# Patient Record
Sex: Male | Born: 1935 | Race: White | Hispanic: No | Marital: Single | Smoking: Never smoker
Health system: Southern US, Community
[De-identification: ages and names within clinical notes are randomized; demographics above are authoritative.]

## PROBLEM LIST (undated history)

## (undated) DIAGNOSIS — C801 Malignant (primary) neoplasm, unspecified: Secondary | ICD-10-CM

## (undated) DIAGNOSIS — C73 Malignant neoplasm of thyroid gland: Secondary | ICD-10-CM

## (undated) DIAGNOSIS — K219 Gastro-esophageal reflux disease without esophagitis: Secondary | ICD-10-CM

## (undated) DIAGNOSIS — I4891 Unspecified atrial fibrillation: Secondary | ICD-10-CM

## (undated) DIAGNOSIS — E039 Hypothyroidism, unspecified: Secondary | ICD-10-CM

## (undated) DIAGNOSIS — M1990 Unspecified osteoarthritis, unspecified site: Secondary | ICD-10-CM

## (undated) HISTORY — PX: THYROIDECTOMY: SHX17

## (undated) HISTORY — DX: Malignant neoplasm of thyroid gland: C73

## (undated) HISTORY — DX: Malignant (primary) neoplasm, unspecified: C80.1

## (undated) NOTE — *Deleted (*Deleted)
[facility]  [address] [place], [place] [address] Phone: [phone]  Fax: [phone]   Clinic Day:  12/15/2019  Referring physician: Mick Sell, MD  Chief Complaint: Grant Freeman is a 77 y.o. male with medullary thyroid cancer who is seen for assessment, review of work-up, and discussion regarding direction of therapy.  HPI: The patient was last seen in the medical oncology clinic on 12/02/2019. At that time, he denied any new symptoms.  Exam was stable.  We discussed plans for biopsy.  [facility] genetic testing was sent.  Right supraclavicular lymph node biopsy on 12/11/2019 revealed ***  During the interim, ***   Past Medical History:  Diagnosis Date  . Thyroid cancer, medullary carcinoma [facility])     Past Surgical History:  Procedure Laterality Date  . THYROIDECTOMY      No family history on file.  Social History:  reports that he has never smoked. He has quit using smokeless tobacco. He reports current alcohol use of about 2.0 standard drinks of alcohol per week. He reports that he does not use drugs. The patient drinks beer or wine a couple times per weeks. Denies tobacco or drug use. He still lives in [place]. He is a retired Personnel officer. The patient is alone today.  Allergies:  Allergies  Allergen Reactions  . Penicillins Hives    Current Medications: Current Outpatient Medications  Medication Sig Dispense Refill  . acetaminophen (TYLENOL) 500 MG tablet Take by mouth. (Patient not taking: Reported on 10/29/2019)    . hydrOXYzine (VISTARIL) 25 MG capsule Take 25 mg by mouth every 6 (six) hours as needed.     Marland Kitchen levothyroxine (SYNTHROID, LEVOTHROID) 112 MCG tablet Take 1 tablet by mouth daily.    Marland Kitchen MELATONIN PO Take 1 Dose by mouth at bedtime as needed.     . saw palmetto 80 MG capsule Take 80 mg by mouth 2 (two) times daily. (Patient not taking: Reported on 10/29/2019)    . vandetanib (CAPRELSA) 300 MG tablet  Take 1 tablet (300 mg total) by mouth daily. (Patient not taking: Reported on 10/29/2019) 30 tablet 0   No current facility-administered medications for this visit.  Review of Systems  Constitutional: Negative for chills, diaphoresis, fever, malaise/fatigue and weight loss (up 1 lb).  HENT: Negative for congestion, ear discharge, ear pain, hearing loss, nosebleeds, sinus pain, sore throat and tinnitus.        Hoarse voice.  Eyes: Negative for blurred vision.  Respiratory: Negative for cough, hemoptysis, sputum production and shortness of breath.   Cardiovascular: Negative for chest pain, palpitations and leg swelling.  Gastrointestinal: Negative for abdominal pain, blood in stool, constipation, diarrhea, heartburn, melena, nausea and vomiting.       Consuming baking powder and maple syrup twice weekly.  Genitourinary: Negative for dysuria, frequency, hematuria and urgency.  Musculoskeletal: Negative for back pain, joint pain, myalgias and neck pain.  Skin: Negative for itching and rash.  Neurological: Negative for dizziness, tingling, sensory change, weakness and headaches.  Endo/Heme/Allergies: Does not bruise/bleed easily.  Psychiatric/Behavioral: Negative for depression and memory loss. The patient is not nervous/anxious and does not have insomnia.   All other systems reviewed and are negative.  Performance status (ECOG): 1***  Vitals There were no vitals taken for this visit.   Physical Exam Vitals and nursing note reviewed.  Constitutional:      General: He is not in acute distress.    Appearance: He is not diaphoretic.  Eyes:  General: No scleral icterus.    Conjunctiva/sclera: Conjunctivae normal.  Neurological:     Mental Status: He is alert and oriented to person, place, and time.  Psychiatric:        Behavior: Behavior normal.        Thought Content: Thought content normal.        Judgment: Judgment normal.    No visits with results within 3 Day(s) from this  visit.  Latest known visit with results is:  Office Visit on 11/25/2019  Component Date Value Ref Range Status  . Calcitonin 11/25/2019 4,975.0* 0.0 - 8.4 pg/mL Final   Comment: (NOTE) **Results verified by repeat testing** Siemens Immulite 2000 Immunochemiluminometric assay (ICMA) Values obtained with different assay methods or kits cannot be used interchangeably. Results cannot be interpreted as absolute evidence of the presence or absence of malignant disease. Performed At: [facility] [address] [place], [place] [id number] Jolene Schimke MD YQ:[id number]   . CEA 11/25/2019 77.1* 0.0 - 4.7 ng/mL Final   Comment: (NOTE)                             Nonsmokers          <3.9                             Smokers             <5.6 Roche [employer] Electrochemiluminescence Immunoassay (ECLIA) Values obtained with different assay methods or kits cannot be used interchangeably.  Results cannot be interpreted as absolute evidence of the presence or absence of malignant disease. Performed At: [facility] [address] [place], [place] [id number] Jolene Schimke MD WN:[id number]   . Sodium 11/25/2019 136  135 - 145 mmol/L Final  . Potassium 11/25/2019 4.5  3.5 - 5.1 mmol/L Final  . Chloride 11/25/2019 103  98 - 111 mmol/L Final  . CO2 11/25/2019 24  22 - 32 mmol/L Final  . Glucose, Bld 11/25/2019 107* 70 - 99 mg/dL Final   Glucose reference range applies only to samples taken after fasting for at least 8 hours.  . BUN 11/25/2019 13  8 - 23 mg/dL Final  . Creatinine, Ser 11/25/2019 0.83  0.61 - 1.24 mg/dL Final  . Calcium 03/47/4259 9.2  8.9 - 10.3 mg/dL Final  . Total Protein 11/25/2019 8.1  6.5 - 8.1 g/dL Final  . Albumin 56/38/7564 4.6  3.5 - 5.0 g/dL Final  . AST 33/29/5188 33  15 - 41 U/L Final  . ALT 11/25/2019 45* 0 - 44 U/L Final  . Alkaline Phosphatase 11/25/2019 55  38 - 126 U/L Final  . Total Bilirubin 11/25/2019 1.1  0.3 - 1.2 mg/dL Final  . GFR calc  non Af Amer 11/25/2019 >60  >60 mL/min Final  . GFR calc Af Amer 11/25/2019 >60  >60 mL/min Final  . Anion gap 11/25/2019 9  5 - 15 Final   Performed at [facility], [address], [place], [place] [address]  . WBC 11/25/2019 5.8  4.0 - 10.5 K/uL Final  . RBC 11/25/2019 5.12  4.22 - 5.81 MIL/uL Final  . Hemoglobin 11/25/2019 16.0  13.0 - 17.0 g/dL Final  . HCT 63/02/6008 47.1  39 - 52 % Final  . MCV 11/25/2019 92.0  80.0 - 100.0 fL Final  . MCH 11/25/2019 31.3  26.0 - 34.0 pg Final  .  MCHC 11/25/2019 34.0  30.0 - 36.0 g/dL Final  . RDW 16/11/9602 14.2  11.5 - 15.5 % Final  . Platelets 11/25/2019 290  150 - 400 K/uL Final  . nRBC 11/25/2019 0.0  0.0 - 0.2 % Final  . Neutrophils Relative % 11/25/2019 62  % Final  . Neutro Abs 11/25/2019 3.6  1.7 - 7.7 K/uL Final  . Lymphocytes Relative 11/25/2019 21  % Final  . Lymphs Abs 11/25/2019 1.2  0.7 - 4.0 K/uL Final  . Monocytes Relative 11/25/2019 11  % Final  . Monocytes Absolute 11/25/2019 0.6  0.1 - 1.0 K/uL Final  . Eosinophils Relative 11/25/2019 5  % Final  . Eosinophils Absolute 11/25/2019 0.3  0.0 - 0.5 K/uL Final  . Basophils Relative 11/25/2019 1  % Final  . Basophils Absolute 11/25/2019 0.1  0.0 - 0.1 K/uL Final  . Immature Granulocytes 11/25/2019 0  % Final  . Abs Immature Granulocytes 11/25/2019 0.01  0.00 - 0.07 K/uL Final   Performed at [facility], [address] [place], [place] [address]    Assessment:  Grant Freeman is a 38 y.o. male with stage IV medullary thyroid cancer s/p medullary thyroid cancer s/p bilateral thyroidectomy 07/06/2000.  Pathology revealed a 5.2 cm medullary carcinoma. Cancer extended up to but not through the thyroid capsule. 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 Right sentinel lymph node was negative. Left central neck lymph node was positive with a small focus of metastatic medullary carcinoma. Two lymph nodes in the left upper  mediastinum were negative for cancer and a third paratracheal lymph node was negative.  He received an unknown number cycles of carboplatin and Taxol beginning in 11/2001.  He developed recurrent disease in 2006. He underwent right neck dissection on 10/04/2004.  There was a 4.2 cm soft tissue mass of recurrent medullary thyroid cancer. Four of 4 right-sided neck nodes were positive.  The largest node was 4.2 cm with extracapsular extension into the soft tissue.  A right neck level IIB dissection revealed 8 of 11 lymph nodes positive for metastatic thyroid cancer. The largest focus was 5 mm with tumor present in perinodal lymphatics.  He received DTIC (dacarbazine) with some improvement in serum markers in 2010.  PET scan on 03/11/2010 revealed increased FDG uptake in the left base of neck evidence of distant metastasis.  Notes indicate additional neck dissection in 2012 for recurrent disease.  He received vandetanib from 05/2009 through 05/17/2010. Treatment was interrupted in 12/2009 secondary to an infection in his arm. Dose was reduced from 300 mg a day to 02/2010 at 200 mg a day in 02/2010. He believes his tumor marker dramatically improved with therapy.  He received no TKI therapy between 2012 in 2014 secondary to patient refusal. Calcitonin remained in the 875 range in 03/2012.  In 03/2012, he began ingesting and applying "blood root" to his neck.  PET scan on 10/18/2012 revealed an increase in a 3.25mm nodule to 5mm, but no visible disease in neck, mediastinum or abdomen.  Recommendation was for no vandetanib.  PET scan on 04/01/2016 was suboptimal secondary to altered biomechanics after recent food ingestion prior to the scan. There was new retrotracheal, paraesophageal soft tissue nodule without increased metabolic activity (may falsely lower SUV secondary to altered biomechanics).There was unchanged subcentimeter pulmonary nodule in the right upper lobe which was below the size threshold  for PET imaging.  Neck soft tissue CT scan on 04/01/2016 revealed new 1.2 x  0.9 cm retrotracheal, paraesophageal enhancing soft tissue nodule suspicious for metastatic disease, but does not appear to be associated with definite increased metabolic activity.  There was redemonstration of a 1.6 x 0.9 cm enhancing soft tissue nodules in the right thyroid bed and adjacent to the cricothyroid cartilage.  There was no significant interval change in size of right 6 mm apical pulmonary nodule.  [facility] scan [facility] on 09/30/2016 revealed redemonstrated minimally FDG avid retrotracheal, paraesophageal soft tissue with limited comparison to prior study given previous muscle uptake. This remained concerning for possible metastatic disease. There was unchanged CT appearance of enhancing cricothyroid soft tissue also with mild FDG avidity, likely ectopic thyroid tissue or level VI lymph nodes;  metastatic disease was not excluded. There was no evidence of new FDG avid disease.  Diagnostic neck CT on 09/30/2016 revealed stable appearance of right retrotracheal, paraesophageal enhancing soft tissue nodule (1.3 x 0.9 cm compared to 1.2 x 0.9 cm). This lesion was suspicious for metastatic disease. Attention on follow-up.  There was redemonstration of extensive enhancing soft tissue nodules in the right thyroid bed and adjacent the cricothyroid cartilage which were unchanged in size and may represent residual or regenerated thyroid tissue.  There was opacification of the maxillary, ethmoid and frontal sinuses which may be seen in the setting of sinusitis.  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   PET scan on 11/21/2019 noted diffuse low level hypermetabolism seen in the left thyroid cartilage region including asymmetric soft tissue just superficial to the cartilage. This may reflect the FDG avid "cricopharyngeal soft tissue" described in the outside report from 2018. Metastatic disease remained a concern. There was a  1.9 cm right retrotracheal soft tissue nodule in the region of the thoracic inlet shows low level hypermetabolism, concerning for metastatic disease. There was a 9 mm anterior right upper lobe pulmonary nodule increased from 4 mm on the study 9 years ago.  There was no hypermetabolism. Relatively slow growth would suggest a benign etiology, but follow-up may be warranted to exclude low-grade, poorly FDG avid neoplasm. There was a 5 mm right lower lobe pulmonary nodule new since 2012. Continue attention on follow-up recommended. There was hepatic steatosis.  Soft tissue neck CT on 11/22/2019 revealed a 2 cm mass at the right tracheoesophageal groove  new from PET scan in 2012, presumed local recurrence. A 7 x 17 mm nodule anterior to the upper cricoid cartilage was also worrisome, but not hypermetabolic. There was a 11 mm lymph node at the junction of the right supraclavicular fossa and IJ chain, larger than in 2012 and suspicious.  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 There was a right upper lobe pulmonary nodule, reference preceding PET-CT.  Calcitonin has been followed:  2,721 on 05/24/2010, 706 on 08/11/2010, 714 on 10/06/2010, 768 on 11/26/2010, 1,013 on 07/14/2011, 893 on 04/03/2012, 956 on 06/04/2012, 653 on 10/01/2012, 967 on 10/18/2012, 601 on 12/03/2012, 753 and 11/05/2013, 1,121 on 02/06/2014, 833 on 05/06/2014, 1321 on 08/19/2014, 1171 on 03/10/2016, 1411 on 12/22/2015, 1,254 on 04/06/2016, 1260 on 09/13/2016, 2000 on 04/20/2018, 4831 on 10/16/2019, and 4975.0 on 11/25/2019.  CEA has been followed:  43 and 09/16/2009, 40.8 on 10/05/2009, 38.5 on 11/09/2009, 46.6 on 12/16/2009, 36.9 on 01/27/2010, 40.1 on 02/23/2010, 42.1 on 03/25/2010, 47.9 on 04/20/2010, 44.6 on 05/24/2010, 22.2 on 08/11/2010, 15.2 on 10/06/2010, 18.4 on 11/26/2010, 19.4 on 07/14/2011, 23 on 04/03/2012, 22.6 on 10/01/2012, 35.4 on 12/22/2015, 43.9 on 04/06/2016, 43.5 on 09/13/2016, 69.4 on 04/20/2018, 73.7  on 10/16/2019, and  77.1 on 11/25/2019.  The patient does not plan to receive the COVID-19 vaccine.  Symptomatically, ***  Plan: 1.   Review labs   2.   Medullary thyroid carcinoma             Clinically, he denies any new symptoms.             His clinical course has been indolent.                         Kinase inhibitor therapy may not be appropriate for slowly progressive/indolent disease.                         Previously, he received vandetinib; vandetinib has a RR of about 37% in clinical trials.                         There are side effects with therapy which can affect quality of life.                                     Side effects include increased QT, dermatologic toxicity, diarrhea, CHF, hypertension, hemorrhage,CVA, ILD, hypocalcemia.             Calcitonin and CEA have increased over time.             Discuss plan for comparison of PET scan at [facility] to current scan.                         For asymptomatic patients with tumor < 1-2 cm tumor growing < 20 %/year, guidelines suggest monitoring.                         For metastasis at least 1-2 cm, growing > 20 %/year or symptomatic, consider treatment.             Patient previously not considered a surgical candidate.             Review interval tumor board discussion.  There has been interval growth.  Discuss plan for biopsy. 3.   [facility] genetic testing. 4.   Ultrasound guided biopsy. 5.   EKG- baseline. 6.   Bethanie Dicker: re vandatrnib coverage. 7.   RTC in 2 weeks for MD assessment, review of work-up, and discussion regarding direction of therapy.  I discussed the assessment and treatment plan with the patient.  The patient was provided an opportunity to ask questions and all were answered.  The patient agreed with the plan and demonstrated an understanding of the instructions.  The patient was advised to call back if the symptoms worsen or if the condition fails to improve as anticipated.  I provided *** minutes of face-to-face  time during this this encounter and > 50% was spent counseling as documented under my assessment and plan.  Melissa C. Merlene Pulling, MD, PhD    12/15/2019, 6:30 PM  I, Danella Penton Tufford, am acting as Neurosurgeon for [employer]. Merlene Pulling, MD, PhD.  I, Melissa C. Merlene Pulling, MD, have reviewed the above documentation for accuracy and completeness, and I agree with the above.

## (undated) NOTE — *Deleted (*Deleted)
Grant Freeman  [address] [place], [place] [address] Phone: [phone]  Fax: [phone]   Clinic Day:  12/21/2019  Referring physician: Mick Sell, MD  Chief Complaint: Grant Freeman is a 11 y.o. male with medullary thyroid cancer who is seen for review of interm biopsy, [facility] genetic testing, and discussion regarding direction of therapy.   HPI: The patient was last seen in the medical oncology clinic on 12/02/2019. At that time, he denied any new symptoms. Exam was stable. We discussed plans for a ultrasound guided biopsy and [facility] genetic testing.   Right supraclavicular biopsy on 12/11/2019 confirmed metastatic carcinoma.  IHC for calcitonin was diffuse and strong staining confirming the diagnosis of metastatic medullary carcinoma of the thyroid.  Patient was seen in the [facility] ER on 12/20/2019 for a peri-rectal abscess.  Patient stated that he had a biopsy on 11/06/2019 in the general surgeon's office.  Patient stated that the surgeon "missed the whole thing".  He stated that he can still feel a boil and wanted it lanced in the emergency department. The area was intermittently painful.  He denied any bleeding or drainage; he denied fever. Exam revealed a 1 cm round, firm umbilicated lesion at 4 o'clock position in the peri-rectal area.  Lesion was non-fluctuant and non-erythematous.  Follow up with general surgery was recommended.   During the interim, ***   Past Medical History:  Diagnosis Date  . Thyroid cancer, medullary carcinoma [facility])     Past Surgical History:  Procedure Laterality Date  . THYROIDECTOMY      No family history on file.  Social History:  reports that he has never smoked. He has quit using smokeless tobacco. He reports current alcohol use of about 2.0 standard drinks of alcohol per week. He reports that he does not use drugs. The patient drinks beer or wine a couple times per weeks. Denies tobacco or  drug use. He still lives in [place]. He is a retired Personnel officer. The patient is alone today.  Allergies:  Allergies  Allergen Reactions  . Penicillins Hives    Current Medications: Current Outpatient Medications  Medication Sig Dispense Refill  . acetaminophen (TYLENOL) 500 MG tablet Take by mouth. (Patient not taking: Reported on 10/29/2019)    . hydrOXYzine (VISTARIL) 25 MG capsule Take 25 mg by mouth every 6 (six) hours as needed.     Marland Kitchen levothyroxine (SYNTHROID, LEVOTHROID) 112 MCG tablet Take 1 tablet by mouth daily.    Marland Kitchen lidocaine (XYLOCAINE) 2 % jelly Apply 1 application topically as needed. 30 mL 0  . MELATONIN PO Take 1 Dose by mouth at bedtime as needed.     . saw palmetto 80 MG capsule Take 80 mg by mouth 2 (two) times daily. (Patient not taking: Reported on 10/29/2019)    . vandetanib (CAPRELSA) 300 MG tablet Take 1 tablet (300 mg total) by mouth daily. (Patient not taking: Reported on 10/29/2019) 30 tablet 0   No current facility-administered medications for this visit.  Review of Systems  Constitutional: Negative for chills, diaphoresis, fever, malaise/fatigue and weight loss (up 1 lb).  HENT: Negative for congestion, ear discharge, ear pain, hearing loss, nosebleeds, sinus pain, sore throat and tinnitus.        Hoarse voice.  Eyes: Negative for blurred vision.  Respiratory: Negative for cough, hemoptysis, sputum production and shortness of breath.   Cardiovascular: Negative for chest pain, palpitations and leg swelling.  Gastrointestinal: Negative for abdominal pain, blood in  stool, constipation, diarrhea, heartburn, melena, nausea and vomiting.       Consuming baking powder and maple syrup twice weekly.  Genitourinary: Negative for dysuria, frequency, hematuria and urgency.  Musculoskeletal: Negative for back pain, joint pain, myalgias and neck pain.  Skin: Negative for itching and rash.  Neurological: Negative for dizziness, tingling, sensory change, weakness and  headaches.  Endo/Heme/Allergies: Does not bruise/bleed easily.  Psychiatric/Behavioral: Negative for depression and memory loss. The patient is not nervous/anxious and does not have insomnia.   All other systems reviewed and are negative.  Performance status (ECOG): 1  Vitals There were no vitals taken for this visit.   Physical Exam Vitals and nursing note reviewed.  Constitutional:      General: He is not in acute distress.    Appearance: He is not diaphoretic.  Eyes:     General: No scleral icterus.    Conjunctiva/sclera: Conjunctivae normal.  Neurological:     Mental Status: He is alert and oriented to person, place, and time.  Psychiatric:        Behavior: Behavior normal.        Thought Content: Thought content normal.        Judgment: Judgment normal.    No visits with results within 3 Day(s) from this visit.  Latest known visit with results is:  [facility] on 12/11/2019  Component Date Value Ref Range Status  . SURGICAL PATHOLOGY 12/11/2019    Final-Edited                   Value:SURGICAL PATHOLOGY **** THIS IS AN ADDENDUM REPORT **** CASE: [id number] PATIENT: Grant Freeman Surgical Pathology Report **********Addendum **********  Reason for Addendum #1:  Immunohistochemistry results  Specimen Submitted: A. Lymph node, right supraclavicular; biopsy  Clinical History: Medullary carcinoma of the thyroid with recurrence. Previous thyroidectomy at [facility] in 2002 with positive lymph nodes, and resection of nodal metastases at [facility] in 2006 and 2012. Post chemotherapy and kinase inhibitor therapy.     DIAGNOSIS: A. LYMPH NODE, RIGHT SUPRACLAVICULAR; ULTRASOUND-GUIDED CORE BIOPSY: - METASTATIC CARCINOMA, SEE COMMENT.  Comment: A few very tiny deposits of metastatic carcinoma are present, confirmed by cytokeratin immunohistochemistry (IHC) (AE1/AE3/PCK26). The tumor cells show nested growth, minimal pleomorphism, and absent mitotic figures,  morphologically compatible with metastatic medullary carcinoma of the thyroid.  Calcitonin IHC has b                         een ordered, and the results will be resulted in an addendum.  Cytokeratin IHC slides were prepared by [facility] Hines, [place]. All controls stained appropriately.  This test was developed and its performance characteristics determined by [facility]. It has not been cleared or approved by the [place] Food and Drug Administration. The FDA does not require this test to go through premarket FDA review. This test is used for clinical purposes. It should not be regarded as investigational or for research. This laboratory is certified under the Clinical Laboratory Improvement Amendments (CLIA) as qualified to perform high complexity clinical laboratory testing.  GROSS DESCRIPTION: A. Labeled: Right supraclavicular node Received: Formalin Number of needle core biopsy(s): 3 cores with multiple additional fragments Length: Range from 0.7 to 0.9 cm Diameter: 0.1 cm Description: Received are cores and fragments of tan-gray soft tissue. The fragments are 0.7 x 0.5 x 0.1 cm in aggregat  e. Ink: None Entirely submitted in cassettes 1-4 with 1 core per cassettes 1-3 and the remaining fragments in cassette 4.    Final Diagnosis performed by Ronald Lobo, MD.   Electronically signed 12/16/2019 12:42:13PM The electronic signature indicates that the named Attending Pathologist has evaluated the specimen Technical component performed at [facility], [address] [place], [place] [address] Lab: [phone] Dir: Jolene Schimke, MD, MMM  Professional component performed at [facility], [facility], [address] [place], [place], [place] [address] Lab: [phone] Dir: Georgiann Cocker. Oneita Kras, MD  ADDENDUM: IHC for calcitonin was performed. The tumor cells are positive for calcitonin with diffuse and strong staining. The result confirms the diagnosis of  metastatic medullary carcinoma of the thyroid.  IHC slides were prepared by [facility] [facility], [place], [place]. All controls stained appropriately.  This test was develope                         d and its performance characteristics determined by [facility]. It has not been cleared or approved by the [place] Food and Drug Administration. The FDA does not require this test to go through premarket FDA review. This test is used for clinical purposes. It should not be regarded as investigational or for research. This laboratory is certified under the Clinical Laboratory Improvement Amendments (CLIA) as qualified to perform high complexity clinical laboratory testing.    Addendum #1 performed by Ronald Lobo, MD.   Electronically signed 12/20/2019 12:01:27PM The electronic signature indicates that the named Attending Pathologist has evaluated the specimen Technical component performed at [facility], [address], [place], [place] [address] Lab: [phone] Dir: Jolene Schimke, MD, MMM  Professional component performed at [facility], [facility], [address] [place], [place], [place] [address] Lab: [phone] Dir: Georgiann Cocker. Rubinas, MD     Assessment:  Grant Freeman is a 18 y.o. male with stage IV medullary thyroid cancer s/p medullary thyroid cancer s/p bilateral thyroidectomy 07/06/2000.  Pathology revealed a 5.2 cm medullary carcinoma. Cancer extended up to but not through the thyroid capsule. 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 Right sentinel lymph node was negative. Left central neck lymph node was positive with a small focus of metastatic medullary carcinoma. Two lymph nodes in the left upper mediastinum were negative for cancer and a third paratracheal lymph node was negative.  He received an unknown number cycles of carboplatin and Taxol beginning in 11/2001.  He developed recurrent disease in 2006. He  underwent right neck dissection on 10/04/2004.  There was a 4.2 cm soft tissue mass of recurrent medullary thyroid cancer. Four of 4 right-sided neck nodes were positive.  The largest node was 4.2 cm with extracapsular extension into the soft tissue.  A right neck level IIB dissection revealed 8 of 11 lymph nodes positive for metastatic thyroid cancer. The largest focus was 5 mm with tumor present in perinodal lymphatics.  He received DTIC (dacarbazine) with some improvement in serum markers in 2010.  PET scan on 03/11/2010 revealed increased FDG uptake in the left base of neck evidence of distant metastasis.  Notes indicate additional neck dissection in 2012 for recurrent disease.  He received vandetanib from 05/2009 through 05/17/2010. Treatment was interrupted in 12/2009 secondary to an infection in his arm. Dose was reduced from 300 mg a day to 02/2010 at 200 mg a day in 02/2010. He believes his tumor marker dramatically improved with therapy.  He received no TKI  therapy between 2012 in 2014 secondary to patient refusal. Calcitonin remained in the 875 range in 03/2012.  In 03/2012, he began ingesting and applying "blood root" to his neck.  PET scan on 10/18/2012 revealed an increase in a 3.47mm nodule to 5mm, but no visible disease in neck, mediastinum or abdomen.  Recommendation was for no vandetanib.  PET scan on 04/01/2016 was suboptimal secondary to altered biomechanics after recent food ingestion prior to the scan. There was new retrotracheal, paraesophageal soft tissue nodule without increased metabolic activity (may falsely lower SUV secondary to altered biomechanics).There was unchanged subcentimeter pulmonary nodule in the right upper lobe which was below the size threshold for PET imaging.  Neck soft tissue CT scan on 04/01/2016 revealed new 1.2 x 0.9 cm retrotracheal, paraesophageal enhancing soft tissue nodule suspicious for metastatic disease, but does not appear to be associated  with definite increased metabolic activity.  There was redemonstration of a 1.6 x 0.9 cm enhancing soft tissue nodules in the right thyroid bed and adjacent to the cricothyroid cartilage.  There was no significant interval change in size of right 6 mm apical pulmonary nodule.  PET scan at [facility] on 09/30/2016 revealed redemonstrated minimally FDG avid retrotracheal, paraesophageal soft tissue with limited comparison to prior study given previous muscle uptake. This remained concerning for possible metastatic disease. There was unchanged CT appearance of enhancing cricothyroid soft tissue also with mild FDG avidity, likely ectopic thyroid tissue or level VI lymph nodes;  metastatic disease was not excluded. There was no evidence of new FDG avid disease.  Diagnostic neck CT on 09/30/2016 revealed stable appearance of right retrotracheal, paraesophageal enhancing soft tissue nodule (1.3 x 0.9 cm compared to 1.2 x 0.9 cm). This lesion was suspicious for metastatic disease. Attention on follow-up.  There was redemonstration of extensive enhancing soft tissue nodules in the right thyroid bed and adjacent the cricothyroid cartilage which were unchanged in size and may represent residual or regenerated thyroid tissue.  There was opacification of the maxillary, ethmoid and frontal sinuses which may be seen in the setting of sinusitis.  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   PET scan on 11/21/2019 noted diffuse low level hypermetabolism seen in the left thyroid cartilage region including asymmetric soft tissue just superficial to the cartilage. This may reflect the FDG avid "cricopharyngeal soft tissue" described in the outside report from 2018. Metastatic disease remained a concern. There was a 1.9 cm right retrotracheal soft tissue nodule in the region of the thoracic inlet shows low level hypermetabolism, concerning for metastatic disease. There was a 9 mm anterior right upper lobe pulmonary nodule increased  from 4 mm on the study 9 years ago.  There was no hypermetabolism. Relatively slow growth would suggest a benign etiology, but follow-up may be warranted to exclude low-grade, poorly FDG avid neoplasm. There was a 5 mm right lower lobe pulmonary nodule new since 2012. Continue attention on follow-up recommended. There was hepatic steatosis.  Soft tissue neck CT on 11/22/2019 revealed a 2 cm mass at the right tracheoesophageal groove  new from PET scan in 2012, presumed local recurrence. A 7 x 17 mm nodule anterior to the upper cricoid cartilage was also worrisome, but not hypermetabolic. There was a 11 mm lymph node at the junction of the right supraclavicular fossa and IJ chain, larger than in 2012 and suspicious.  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 There was a right upper lobe pulmonary nodule, reference preceding PET-CT.  Right supraclavicular biopsy  on 12/11/2019 confirmed metastatic carcinoma.  IHC for calcitonin was diffuse and strong staining confirming the diagnosis of metastatic medullary carcinoma of the thyroid.  Calcitonin has been followed:  2,721 on 05/24/2010, 706 on 08/11/2010, 714 on 10/06/2010, 768 on 11/26/2010, 1,013 on 07/14/2011, 893 on 04/03/2012, 956 on 06/04/2012, 653 on 10/01/2012, 967 on 10/18/2012, 601 on 12/03/2012, 753 and 11/05/2013, 1,121 on 02/06/2014, 833 on 05/06/2014, 1321 on 08/19/2014, 1171 on 03/10/2016, 1411 on 12/22/2015, 1,254 on 04/06/2016, 1260 on 09/13/2016, 2000 on 04/20/2018, 4831 on 10/16/2019, and 4975.0 on 11/25/2019.  CEA has been followed:  43 and 09/16/2009, 40.8 on 10/05/2009, 38.5 on 11/09/2009, 46.6 on 12/16/2009, 36.9 on 01/27/2010, 40.1 on 02/23/2010, 42.1 on 03/25/2010, 47.9 on 04/20/2010, 44.6 on 05/24/2010, 22.2 on 08/11/2010, 15.2 on 10/06/2010, 18.4 on 11/26/2010, 19.4 on 07/14/2011, 23 on 04/03/2012, 22.6 on 10/01/2012, 35.4 on 12/22/2015, 43.9 on 04/06/2016, 43.5 on 09/13/2016, 69.4 on 04/20/2018, 73.7 on 10/16/2019, and 77.1 on  11/25/2019.  The patient does not plan to receive the COVID-19 vaccine.  Symptomatically, ***  Plan: 1.   Review work up.   2.   Medullary thyroid carcinoma             Clinically, he denies any new symptoms.             His clinical course has been indolent.                         Kinase inhibitor therapy may not be appropriate for slowly progressive/indolent disease.                         Previously, he received vandetinib; vandetinib has a RR of about 37% in clinical trials.                         There are side effects with therapy which can affect quality of life.                                     Side effects include increased QT, dermatologic toxicity, diarrhea, CHF, hypertension, hemorrhage,CVA, ILD, hypocalcemia.             Calcitonin and CEA have increased over time.             Discuss plan for comparison of PET scan at [facility] to current scan.                         For asymptomatic patients with tumor < 1-2 cm tumor growing < 20 %/year, guidelines suggest monitoring.                         For metastasis at least 1-2 cm, growing > 20 %/year or symptomatic, consider treatment.             Patient previously not considered a surgical candidate.             Review interval tumor board discussion.  There has been interval growth.  Discuss plan for biopsy. 3.   [facility] genetic testing. 4.   Ultrasound guided biopsy. 5.   EKG- baseline. 6.   Bethanie Dicker: re vandatrnib coverage. 7.   RTC in 2 weeks for MD assessment, review of work-up, and discussion regarding direction of  therapy.  I discussed the assessment and treatment plan with the patient.  The patient was provided an opportunity to ask questions and all were answered.  The patient agreed with the plan and demonstrated an understanding of the instructions.  The patient was advised to call back if the symptoms worsen or if the condition fails to improve as anticipated.  I provided *** minutes of face-to-face time  during this encounter and > 50% was spent counseling as documented under my assessment and plan.    Melissa C. Merlene Pulling, MD, PhD    12/21/2019, 1:30 PM  I, Theador Hawthorne, am acting as scribe for [employer]. Merlene Pulling, MD, PhD.  {Add scribe attestation statement}

## (undated) NOTE — *Deleted (*Deleted)
[facility]  [address], [address] [place], [place] [address] Phone: [phone]  Fax: [phone]   Clinic Day:  01/21/2020  Referring physician: Mick Sell, MD  Chief Complaint: Grant Freeman is a 35 y.o. male with medullary thyroid cancer who is seen for 2 week assessment.   HPI: The patient was last seen in the medical oncology clinic on 01/07/2020. At that time, he noted issues with a perianal "boil".  He described occasional shortness of breath.  Exam was c/w atrial fibrillation.  Hematocrit was 47.8, hemoglobin 16.3, platelets 300,000, WBC 6,700. ALT was 53.  TSH was 0.016 and free T4 was 0.85.  We discussed referral to cardiology and clearance to begin vandetinib.  The patient saw Marga Hoots, cardiology PA, on 01/09/2020. ECG revealed atrial fibrillation at a controlled ventricular response of 98 bpm with no evidence of acute ischemia. He was prescribed metoprolol succinate 25 mg daily and Eliquis 5 mg BID. A 7 day Holter monitor was placed to assess atrial fibrillation burden.  Echocardiogram was scheduled.  He was counseled regarding refraining from alcohol.  Follow up was planned in 6 weeks (around 02/20/2020).   The patient saw Dr. Sampson Goon on 01/16/2020 for anal pain.  Exam revealed a hemorrhoid and an area of induration at the 1 o'clock position.  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  Reassessment by surgery was discussed.  Dr Claudine Mouton was contacted.  During the interim, ***    Past Medical History:  Diagnosis Date  . Thyroid cancer, medullary carcinoma [facility])     Past Surgical History:  Procedure Laterality Date  . THYROIDECTOMY      No family history on file.  Social History:  reports that he has never smoked. He has quit using smokeless tobacco. He reports current alcohol use of about 2.0 standard drinks of alcohol per week. He reports that he does not use drugs. The patient drinks  beer or wine a couple times per weeks. Denies tobacco or drug use. He still lives in [place]. He is a retired Personnel officer. The patient is alone today.  Allergies:  Allergies  Allergen Reactions  . Penicillins Hives    Current Medications: Current Outpatient Medications  Medication Sig Dispense Refill  . levothyroxine (SYNTHROID, LEVOTHROID) 112 MCG tablet Take 1 tablet by mouth daily.    Marland Kitchen lidocaine (XYLOCAINE) 2 % jelly Apply 1 application topically as needed. 30 mL 0   No current facility-administered medications for this visit.  Review of Systems  Constitutional: Negative for chills, diaphoresis, fever, malaise/fatigue and weight loss (stable).       "Could be better."  HENT: Negative for congestion, ear discharge, ear pain, hearing loss, nosebleeds, sinus pain, sore throat and tinnitus.        Hoarse voice.  Eyes: Negative for blurred vision.  Respiratory: Negative for cough, hemoptysis, sputum production and shortness of breath.   Cardiovascular: Negative for chest pain, palpitations and leg swelling.  Gastrointestinal: Negative for abdominal pain, blood in stool, constipation, diarrhea, heartburn, melena, nausea and vomiting.  Genitourinary: Negative for dysuria, frequency, hematuria and urgency.  Musculoskeletal: Positive for myalgias (leg cramps). Negative for back pain, joint pain and neck pain.  Skin: Negative for itching and rash.       Left ankle wrapped because of an infection  Neurological: Negative for dizziness, tingling, sensory change, weakness and headaches.  Endo/Heme/Allergies: Does not bruise/bleed easily.  Psychiatric/Behavioral: Negative for depression and memory loss. The patient  has insomnia (last night). The patient is not nervous/anxious.   All other systems reviewed and are negative.  Performance status (ECOG): 1***  Vitals There were no vitals taken for this visit.   Physical Exam Vitals and nursing note reviewed.  Constitutional:       General: He is not in acute distress.    Appearance: He is not diaphoretic.  HENT:     Head: Normocephalic and atraumatic.     Mouth/Throat:     Mouth: Mucous membranes are moist.     Pharynx: Oropharynx is clear.  Eyes:     General: No scleral icterus.    Extraocular Movements: Extraocular movements intact.     Conjunctiva/sclera: Conjunctivae normal.     Pupils: Pupils are equal, round, and reactive to light.  Cardiovascular:     Rate and Rhythm: Normal rate. Rhythm irregular.     Heart sounds: Normal heart sounds. No murmur heard.   Pulmonary:     Effort: Pulmonary effort is normal. No respiratory distress.     Breath sounds: Normal breath sounds. No wheezing or rales.  Chest:     Chest wall: No tenderness.  Abdominal:     General: Bowel sounds are normal. There is no distension.     Palpations: Abdomen is soft. There is no mass.     Tenderness: There is no abdominal tenderness. There is no guarding or rebound.  Musculoskeletal:        General: No swelling or tenderness. Normal range of motion.     Cervical back: Normal range of motion and neck supple.  Lymphadenopathy:     Head:     Right side of head: No preauricular, posterior auricular or occipital adenopathy.     Left side of head: No preauricular, posterior auricular or occipital adenopathy.     Cervical: No cervical adenopathy.     Upper Body:     Right upper body: No supraclavicular or axillary adenopathy.     Left upper body: No supraclavicular or axillary adenopathy.     Lower Body: No right inguinal adenopathy. No left inguinal adenopathy.  Skin:    General: Skin is warm and dry.     Comments: Left ankle is wrapped.  Neurological:     Mental Status: He is alert and oriented to person, place, and time.  Psychiatric:        Behavior: Behavior normal.        Thought Content: Thought content normal.        Judgment: Judgment normal.    No visits with results within 3 Day(s) from this visit.  Latest known  visit with results is:  Hospital Outpatient Visit on 12/11/2019  Component Date Value Ref Range Status  . SURGICAL PATHOLOGY 12/11/2019    Final-Edited                   Value:SURGICAL PATHOLOGY THIS IS AN ADDENDUM REPORT CASE: [id number] PATIENT: Grant Freeman Surgical Pathology Report Addendum  Reason for Addendum #1:  Immunohistochemistry results  Specimen Submitted: A. Lymph node, right supraclavicular; biopsy  Clinical History: Medullary carcinoma of the thyroid with recurrence. Previous thyroidectomy at [facility] in 2002 with positive lymph nodes, and resection of nodal metastases at [facility] in 2006 and 2012. Post chemotherapy and kinase inhibitor therapy.  DIAGNOSIS: A. LYMPH NODE, RIGHT SUPRACLAVICULAR; ULTRASOUND-GUIDED CORE BIOPSY: - METASTATIC CARCINOMA, SEE COMMENT.  Comment: A few very tiny deposits of metastatic carcinoma are present, confirmed by cytokeratin immunohistochemistry (IHC) (AE1/AE3/PCK26). The tumor cells  show nested growth, minimal pleomorphism, and absent mitotic figures, morphologically compatible with metastatic medullary carcinoma of the thyroid.  Calcitonin IHC has b                         een ordered, and the results will be resulted in an addendum.  Cytokeratin IHC slides were prepared by [facility] Hines, [place]. All controls stained appropriately.  This test was developed and its performance characteristics determined by [facility]. It has not been cleared or approved by the [place] Food and Drug Administration. The FDA does not require this test to go through premarket FDA review. This test is used for clinical purposes. It should not be regarded as investigational or for research. This laboratory is certified under the Clinical Laboratory Improvement Amendments (CLIA) as qualified to perform high complexity clinical laboratory testing.  GROSS DESCRIPTION: A. Labeled: Right supraclavicular node Received: Formalin Number of needle core  biopsy(s): 3 cores with multiple additional fragments Length: Range from 0.7 to 0.9 cm Diameter: 0.1 cm Description: Received are cores and fragments of tan-gray soft tissue. The fragments are 0.7 x 0.5 x 0.1 cm in aggregat                         e. Ink: None Entirely submitted in cassettes 1-4 with 1 core per cassettes 1-3 and the remaining fragments in cassette 4.  Final Diagnosis performed by Ronald Lobo, MD.   Electronically signed 12/16/2019 12:42:13PM The electronic signature indicates that the named Attending Pathologist has evaluated the specimen Technical component performed at [facility], [address] [place], [place] [address] Lab: [phone] Dir: Jolene Schimke, MD, MMM  Professional component performed at [facility], [facility], [address] [place], [place], [place] [address] Lab: [phone] Dir: Georgiann Cocker. Oneita Kras, MD  ADDENDUM: IHC for calcitonin was performed. The tumor cells are positive for calcitonin with diffuse and strong staining. The result confirms the diagnosis of metastatic medullary carcinoma of the thyroid.  IHC slides were prepared by [facility], [place], [place]. All controls stained appropriately.  This test was develope                         d and its performance characteristics determined by [facility]. It has not been cleared or approved by the [place] Food and Drug Administration. The FDA does not require this test to go through premarket FDA review. This test is used for clinical purposes. It should not be regarded as investigational or for research. This laboratory is certified under the Clinical Laboratory Improvement Amendments (CLIA) as qualified to perform high complexity clinical laboratory testing.  Addendum #1 performed by Ronald Lobo, MD.   Electronically signed 12/20/2019 12:01:27PM The electronic signature indicates that the named Attending Pathologist has evaluated the specimen  Technical component performed at [facility], [address] [place], [place] [address] Lab: [phone] Dir: Jolene Schimke, MD, MMM  Professional component performed at [facility], [facility], [address] [place], [place], [place] [address] Lab: [phone] Dir: Georgiann Cocker. Rub[place], MD    Assessment:  Grant Freeman is a 22 y.o. male with stage IV medullary thyroid cancer s/p medullary thyroid cancer s/p bilateral thyroidectomy 07/06/2000.  Pathology revealed a 5.2 cm medullary carcinoma. Cancer extended up to but not through the thyroid capsule. 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 Right sentinel lymph node was negative. Left central neck lymph  node was positive with a small focus of metastatic medullary carcinoma. Two lymph nodes in the left upper mediastinum were negative for cancer and a third paratracheal lymph node was negative.  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  Variants of uncertain significance included: ALK c.1184G>A (p.Arg395His) and NF1 c.5288A>T (p.Tyr1763Phe).  He received an unknown number cycles of carboplatin and Taxol beginning in 11/2001.  He developed recurrent disease in 2006. He underwent right neck dissection on 10/04/2004.  There was a 4.2 cm soft tissue mass of recurrent medullary thyroid cancer. Four of 4 right-sided neck nodes were positive.  The largest node was 4.2 cm with extracapsular extension into the soft tissue.  A right neck level IIB dissection revealed 8 of 11 lymph nodes positive for metastatic thyroid cancer. The largest focus was 5 mm with tumor present in perinodal lymphatics.  He received DTIC (dacarbazine) with some improvement in serum markers in 2010.  PET scan on 03/11/2010 revealed increased FDG uptake in the left base of  neck evidence of distant metastasis.  Notes indicate additional neck dissection in 2012 for recurrent disease.  He received vandetanib from 05/2009 through 05/17/2010. Treatment was interrupted in 12/2009 secondary to an infection in his arm. Dose was reduced from 300 mg a day to 02/2010 at 200 mg a day in 02/2010. He believes his tumor marker dramatically improved with therapy.  He received no TKI therapy between 2012 in 2014 secondary to patient refusal. Calcitonin remained in the 875 range in 03/2012.  In 03/2012, he began ingesting and applying "blood root" to his neck.  PET scan on 10/18/2012 revealed an increase in a 3.67mm nodule to 5mm, but no visible disease in neck, mediastinum or abdomen.  Recommendation was for no vandetanib.  PET scan on 04/01/2016 was suboptimal secondary to altered biomechanics after recent food ingestion prior to the scan. There was new retrotracheal, paraesophageal soft tissue nodule without increased metabolic activity (may falsely lower SUV secondary to altered biomechanics).There was unchanged subcentimeter pulmonary nodule in the right upper lobe which was below the size threshold for PET imaging.  Neck soft tissue CT scan on 04/01/2016 revealed new 1.2 x 0.9 cm retrotracheal, paraesophageal enhancing soft tissue nodule suspicious for metastatic disease, but does not appear to be associated with definite increased metabolic activity.  There was redemonstration of a 1.6 x 0.9 cm enhancing soft tissue nodules in the right thyroid bed and adjacent to the cricothyroid cartilage.  There was no significant interval change in size of right 6 mm apical pulmonary nodule.  PET scan at [facility] on 09/30/2016 revealed redemonstrated minimally FDG avid retrotracheal, paraesophageal soft tissue with limited comparison to prior study given previous muscle uptake. This remained concerning for possible metastatic disease. There was unchanged CT appearance of enhancing cricothyroid  soft tissue also with mild FDG avidity, likely ectopic thyroid tissue or level VI lymph nodes;  metastatic disease was not excluded. There was no evidence of new FDG avid disease.  Diagnostic neck CT on 09/30/2016 revealed stable appearance of right retrotracheal, paraesophageal enhancing soft tissue nodule (1.3 x 0.9 cm compared to 1.2 x 0.9 cm). This lesion was suspicious for metastatic disease. Attention on follow-up.  There was redemonstration of extensive enhancing soft tissue nodules in the right thyroid bed and adjacent the cricothyroid cartilage which were unchanged in size and may represent residual or regenerated  thyroid tissue.  There was opacification of the maxillary, ethmoid and frontal sinuses which may be seen in the setting of sinusitis.  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   PET scan on 11/21/2019 noted diffuse low level hypermetabolism seen in the left thyroid cartilage region including asymmetric soft tissue just superficial to the cartilage. This may reflect the FDG avid "cricopharyngeal soft tissue" described in the outside report from 2018. Metastatic disease remained a concern. There was a 1.9 cm right retrotracheal soft tissue nodule in the region of the thoracic inlet shows low level hypermetabolism, concerning for metastatic disease. There was a 9 mm anterior right upper lobe pulmonary nodule increased from 4 mm on the study 9 years ago.  There was no hypermetabolism. Relatively slow growth would suggest a benign etiology, but follow-up may be warranted to exclude low-grade, poorly FDG avid neoplasm. There was a 5 mm right lower lobe pulmonary nodule new since 2012. Continue attention on follow-up recommended. There was hepatic steatosis.  Soft tissue neck CT on 11/22/2019 revealed a 2 cm mass at the right tracheoesophageal groove  new from PET scan in 2012, presumed local recurrence. A 7 x 17 mm nodule anterior to the upper cricoid cartilage was also worrisome, but not  hypermetabolic. There was a 11 mm lymph node at the junction of the right supraclavicular fossa and IJ chain, larger than in 2012 and suspicious.  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 There was a right upper lobe pulmonary nodule, reference preceding PET-CT.  Right supraclavicular biopsy on 12/11/2019 confirmed metastatic carcinoma.  IHC for calcitonin was diffuse and strong staining confirming the diagnosis of metastatic medullary carcinoma of the thyroid.  Calcitonin has been followed:  2,721 on 05/24/2010, 706 on 08/11/2010, 714 on 10/06/2010, 768 on 11/26/2010, 1,013 on 07/14/2011, 893 on 04/03/2012, 956 on 06/04/2012, 653 on 10/01/2012, 967 on 10/18/2012, 601 on 12/03/2012, 753 and 11/05/2013, 1,121 on 02/06/2014, 833 on 05/06/2014, 1321 on 08/19/2014, 1171 on 03/10/2016, 1411 on 12/22/2015, 1,254 on 04/06/2016, 1260 on 09/13/2016, 2000 on 04/20/2018, 4831 on 10/16/2019, and 4975.0 on 11/25/2019.  CEA has been followed:  43 and 09/16/2009, 40.8 on 10/05/2009, 38.5 on 11/09/2009, 46.6 on 12/16/2009, 36.9 on 01/27/2010, 40.1 on 02/23/2010, 42.1 on 03/25/2010, 47.9 on 04/20/2010, 44.6 on 05/24/2010, 22.2 on 08/11/2010, 15.2 on 10/06/2010, 18.4 on 11/26/2010, 19.4 on 07/14/2011, 23 on 04/03/2012, 22.6 on 10/01/2012, 35.4 on 12/22/2015, 43.9 on 04/06/2016, 43.5 on 09/13/2016, 69.4 on 04/20/2018, 73.7 on 10/16/2019, and 77.1 on 11/25/2019.  EKG on 12/04/2019 revealed atrial fibrillation with a QT of 388 (QTc 453).  The patient does not plan to receive the COVID-19 vaccine.  Symptomatically, ***  Plan: 1.   Review labs    2.   Medullary thyroid carcinoma             Clinically, he denies any new symptoms.             His clinical course has been indolent.                         Kinase inhibitor therapy may not be appropriate for slowly progressive/indolent disease.                         Previously, he received vandetinib; vandetinib has a RR of about 37% in clinical trials.  There are side effects with therapy which can affect quality of life.                                     Side effects include increased QT, dermatologic toxicity, diarrhea, CHF, hypertension, hemorrhage, CVA, ILD, hypocalcemia.             Calcitonin and CEA have increased over time.             Discuss plan for comparison of PET scan at [facility] to current scan.                         For asymptomatic patients with tumor < 1-2 cm tumor growing < 20 %/year, guidelines suggest monitoring.                         For metastasis at least 1-2 cm, growing > 20 %/year or symptomatic, consider treatment.             Patient previously not considered a surgical candidate.             Review interval tumor board discussion.  There has been interval growth.  Discuss plan for biopsy.  Labs today. Patient has appt with Dr Lady Gary Thursday at 9:30 AM.  Please provide patient with instructions how to get there. RTC in 2 weeks for MD assessment.  I discussed the assessment and treatment plan with the patient.  The patient was provided an opportunity to ask questions and all were answered.  The patient agreed with the plan and demonstrated an understanding of the instructions.  The patient was advised to call back if the symptoms worsen or if the condition fails to improve as anticipated.  I provided *** minutes of face-to-face time during this encounter and > 50% was spent counseling as documented under my assessment and plan.    Melissa C. Merlene Pulling, MD, PhD    01/21/2020, 9:11 AM  I, Danella Penton Tufford, am acting as Neurosurgeon for [employer]. Merlene Pulling, MD, PhD.  {Add scribe attestation statement}

---

## 2007-10-30 ENCOUNTER — Ambulatory Visit: Payer: Self-pay | Admitting: Oncology

## 2007-11-12 ENCOUNTER — Ambulatory Visit: Payer: Self-pay | Admitting: Oncology

## 2007-11-20 ENCOUNTER — Ambulatory Visit: Payer: Self-pay | Admitting: Internal Medicine

## 2007-11-29 ENCOUNTER — Ambulatory Visit: Payer: Self-pay | Admitting: Oncology

## 2008-03-06 ENCOUNTER — Emergency Department (HOSPITAL_COMMUNITY): Admission: EM | Admit: 2008-03-06 | Discharge: 2008-03-06 | Payer: Self-pay | Admitting: Emergency Medicine

## 2008-05-20 ENCOUNTER — Emergency Department: Payer: Self-pay | Admitting: Emergency Medicine

## 2009-02-28 ENCOUNTER — Ambulatory Visit: Payer: Self-pay | Admitting: Oncology

## 2009-03-30 ENCOUNTER — Ambulatory Visit: Payer: Self-pay | Admitting: Oncology

## 2009-03-31 ENCOUNTER — Ambulatory Visit: Payer: Self-pay | Admitting: Oncology

## 2009-04-03 ENCOUNTER — Ambulatory Visit: Payer: Self-pay | Admitting: Oncology

## 2009-04-06 ENCOUNTER — Ambulatory Visit: Payer: Self-pay | Admitting: Oncology

## 2009-04-28 ENCOUNTER — Ambulatory Visit: Payer: Self-pay | Admitting: Oncology

## 2009-04-29 ENCOUNTER — Ambulatory Visit: Payer: Self-pay | Admitting: Ophthalmology

## 2009-05-12 ENCOUNTER — Ambulatory Visit: Payer: Self-pay | Admitting: Ophthalmology

## 2009-05-29 ENCOUNTER — Ambulatory Visit: Payer: Self-pay | Admitting: Oncology

## 2009-06-28 ENCOUNTER — Ambulatory Visit: Payer: Self-pay | Admitting: Oncology

## 2009-07-29 ENCOUNTER — Ambulatory Visit: Payer: Self-pay | Admitting: Oncology

## 2009-08-10 ENCOUNTER — Ambulatory Visit: Payer: Self-pay | Admitting: Oncology

## 2009-08-28 ENCOUNTER — Ambulatory Visit: Payer: Self-pay | Admitting: Oncology

## 2009-09-18 LAB — CEA: CEA: 43 ng/mL — ABNORMAL HIGH

## 2009-09-28 ENCOUNTER — Ambulatory Visit: Payer: Self-pay | Admitting: Oncology

## 2009-10-29 ENCOUNTER — Ambulatory Visit: Payer: Self-pay | Admitting: Oncology

## 2009-11-28 ENCOUNTER — Ambulatory Visit: Payer: Self-pay | Admitting: Oncology

## 2009-12-29 ENCOUNTER — Ambulatory Visit: Payer: Self-pay | Admitting: Oncology

## 2010-01-28 ENCOUNTER — Ambulatory Visit: Payer: Self-pay | Admitting: Oncology

## 2010-01-28 LAB — CEA: CEA: 36.9 ng/mL — ABNORMAL HIGH (ref 0.0–4.7)

## 2010-02-28 ENCOUNTER — Ambulatory Visit: Payer: Self-pay | Admitting: Oncology

## 2010-03-26 LAB — CEA: CEA: 42.1 ng/mL — ABNORMAL HIGH (ref 0.0–4.7)

## 2010-03-29 ENCOUNTER — Ambulatory Visit: Payer: Self-pay | Admitting: Oncology

## 2010-03-31 ENCOUNTER — Ambulatory Visit: Payer: Self-pay | Admitting: Oncology

## 2010-04-21 LAB — CEA: CEA: 47.9 ng/mL — ABNORMAL HIGH (ref 0.0–4.7)

## 2010-04-29 ENCOUNTER — Ambulatory Visit: Payer: Self-pay | Admitting: Oncology

## 2010-09-21 ENCOUNTER — Ambulatory Visit: Payer: Self-pay | Admitting: Ophthalmology

## 2010-09-28 ENCOUNTER — Ambulatory Visit: Payer: Self-pay | Admitting: Ophthalmology

## 2011-07-13 DIAGNOSIS — E039 Hypothyroidism, unspecified: Secondary | ICD-10-CM | POA: Insufficient documentation

## 2011-07-13 DIAGNOSIS — C73 Malignant neoplasm of thyroid gland: Secondary | ICD-10-CM | POA: Insufficient documentation

## 2013-02-21 ENCOUNTER — Emergency Department: Payer: Self-pay | Admitting: Emergency Medicine

## 2014-05-08 DIAGNOSIS — G893 Neoplasm related pain (acute) (chronic): Secondary | ICD-10-CM | POA: Insufficient documentation

## 2015-12-21 DIAGNOSIS — C73 Malignant neoplasm of thyroid gland: Secondary | ICD-10-CM | POA: Insufficient documentation

## 2015-12-21 NOTE — Progress Notes (Signed)
Patient seen and evaluated by another provider today.    ROS

## 2015-12-22 ENCOUNTER — Encounter (INDEPENDENT_AMBULATORY_CARE_PROVIDER_SITE_OTHER): Payer: Self-pay

## 2015-12-22 ENCOUNTER — Inpatient Hospital Stay: Payer: Medicare PPO

## 2015-12-22 ENCOUNTER — Encounter: Payer: Self-pay | Admitting: Oncology

## 2015-12-22 ENCOUNTER — Inpatient Hospital Stay: Payer: Medicare PPO | Attending: Oncology | Admitting: Internal Medicine

## 2015-12-22 ENCOUNTER — Inpatient Hospital Stay (HOSPITAL_BASED_OUTPATIENT_CLINIC_OR_DEPARTMENT_OTHER): Payer: Medicare PPO | Admitting: Oncology

## 2015-12-22 DIAGNOSIS — C73 Malignant neoplasm of thyroid gland: Secondary | ICD-10-CM

## 2015-12-22 DIAGNOSIS — Z85850 Personal history of malignant neoplasm of thyroid: Secondary | ICD-10-CM | POA: Insufficient documentation

## 2015-12-22 DIAGNOSIS — G8929 Other chronic pain: Secondary | ICD-10-CM | POA: Diagnosis not present

## 2015-12-22 DIAGNOSIS — Z9221 Personal history of antineoplastic chemotherapy: Secondary | ICD-10-CM | POA: Diagnosis not present

## 2015-12-22 DIAGNOSIS — Z122 Encounter for screening for malignant neoplasm of respiratory organs: Secondary | ICD-10-CM

## 2015-12-22 DIAGNOSIS — M542 Cervicalgia: Secondary | ICD-10-CM | POA: Insufficient documentation

## 2015-12-22 DIAGNOSIS — Z87891 Personal history of nicotine dependence: Secondary | ICD-10-CM

## 2015-12-22 DIAGNOSIS — E890 Postprocedural hypothyroidism: Secondary | ICD-10-CM | POA: Diagnosis not present

## 2015-12-22 LAB — CBC WITH DIFFERENTIAL/PLATELET
BASOS ABS: 0 10*3/uL (ref 0–0.1)
Basophils Relative: 1 %
EOS ABS: 0.2 10*3/uL (ref 0–0.7)
EOS PCT: 4 %
HCT: 42.7 % (ref 40.0–52.0)
Hemoglobin: 14.8 g/dL (ref 13.0–18.0)
LYMPHS ABS: 1.2 10*3/uL (ref 1.0–3.6)
LYMPHS PCT: 24 %
MCH: 30.6 pg (ref 26.0–34.0)
MCHC: 34.7 g/dL (ref 32.0–36.0)
MCV: 88.3 fL (ref 80.0–100.0)
MONO ABS: 0.5 10*3/uL (ref 0.2–1.0)
Monocytes Relative: 10 %
Neutro Abs: 3 10*3/uL (ref 1.4–6.5)
Neutrophils Relative %: 61 %
PLATELETS: 229 10*3/uL (ref 150–440)
RBC: 4.83 MIL/uL (ref 4.40–5.90)
RDW: 16.4 % — AB (ref 11.5–14.5)
WBC: 4.9 10*3/uL (ref 3.8–10.6)

## 2015-12-22 LAB — COMPREHENSIVE METABOLIC PANEL
ALT: 24 U/L (ref 17–63)
AST: 27 U/L (ref 15–41)
Albumin: 4.5 g/dL (ref 3.5–5.0)
Alkaline Phosphatase: 60 U/L (ref 38–126)
Anion gap: 9 (ref 5–15)
BUN: 10 mg/dL (ref 6–20)
CHLORIDE: 105 mmol/L (ref 101–111)
CO2: 23 mmol/L (ref 22–32)
Calcium: 9.2 mg/dL (ref 8.9–10.3)
Creatinine, Ser: 0.63 mg/dL (ref 0.61–1.24)
Glucose, Bld: 114 mg/dL — ABNORMAL HIGH (ref 65–99)
POTASSIUM: 4.1 mmol/L (ref 3.5–5.1)
SODIUM: 137 mmol/L (ref 135–145)
Total Bilirubin: 0.7 mg/dL (ref 0.3–1.2)
Total Protein: 7.3 g/dL (ref 6.5–8.1)

## 2015-12-22 LAB — LACTATE DEHYDROGENASE: LDH: 135 U/L (ref 98–192)

## 2015-12-22 NOTE — Progress Notes (Signed)
Establish care for thyroid cancer. States continues to have chronic neck pain.

## 2015-12-23 LAB — CALCITONIN: Calcitonin: 1411 pg/mL — ABNORMAL HIGH (ref 0.0–8.4)

## 2015-12-23 LAB — CEA: CEA: 35.4 ng/mL — ABNORMAL HIGH (ref 0.0–4.7)

## 2015-12-29 ENCOUNTER — Inpatient Hospital Stay: Payer: Medicare PPO

## 2015-12-31 ENCOUNTER — Ambulatory Visit: Payer: Self-pay | Admitting: Hematology and Oncology

## 2016-01-01 ENCOUNTER — Ambulatory Visit: Payer: Medicare PPO

## 2016-01-05 ENCOUNTER — Inpatient Hospital Stay: Payer: Medicare PPO | Attending: Internal Medicine | Admitting: Internal Medicine

## 2016-01-05 VITALS — BP 156/82 | HR 86 | Temp 97.0°F | Wt 156.5 lb

## 2016-01-05 DIAGNOSIS — R7989 Other specified abnormal findings of blood chemistry: Secondary | ICD-10-CM | POA: Diagnosis not present

## 2016-01-05 DIAGNOSIS — R970 Elevated carcinoembryonic antigen [CEA]: Secondary | ICD-10-CM | POA: Diagnosis not present

## 2016-01-05 DIAGNOSIS — C73 Malignant neoplasm of thyroid gland: Secondary | ICD-10-CM | POA: Diagnosis not present

## 2016-01-05 NOTE — Progress Notes (Addendum)
CC: Re establish care for thyroid cancer.  Primary diagnosis: Thyroid cancer, medullary carcinoma in 2001 Last seen here in 2012. At the time, after an apparent altercation with the provider, he was apparently fired from the clinic and was told never to return back here. He was referred to [facility] for follow up.  HPI:   Oncology History:  2001 hoarse voice ultimately diagnosed with MTC.  5/ 2002 bilateral thyroidectomy; the R thyroid lobe contained a 5.2cm medullary carcinoma extending up to but not through the thyroid capsule. R sentinel node was negative. Hoarse voice with change in voice beginning July or August 2001.    10/ 2003 carboplatin and paclitaxel  09/2004 he underwent a R neck dissection to remove a mass that on pathology was shown to be a 4.2cm recurrent MTC with 4/4 R neck nodes involved; that largest 4.2cm node had ECE. R IIB dissection showed 8/11 positive nodes.  Elevated calcitonin in 2006 in the 2000 range, now in 2012 calcitonin  is 1628 and in 12/2009 it was 1677.   Elevated CEA in the 40 range.   11/2007 PET showed mildly increased FDG uptake in a L supraclav node.  In 2010, he received DTIC chemotherapy, with reported improvement in serum markers.  02/2010 PET showed increased uptake at left base of neck with no evidence for distant spread.  05/2009 through 04/2010 Vandetinib was given at 300mg  per day with some interruption due to infections on his arm. Per [facility] records, the patient refused imaging and TKI for a while and calcitonin was in the 875 range  03/2012. He was instead applying "blood root" which was c/b erythema, scarring and crusting of the skin of the R neck. Dr. Oretha Caprice obtained a new PET and serum tumor markers. Imaging demonstrated only an increase in a 3.11mm nodule to 49mm, but no visible disease in neck, mediastinum or abdomen.   12/14/2012 Dr. Cloyd Stagers recommended against surgery and offered a second opinion at [facility]  He had been in follow up at [facility]  since, but now wants to be seen here closer to home  Interval History: He states continues to have chronic neck pain.   Review of Systems  Constitutional: Negative for chills, diaphoresis, fever, malaise/fatigue and weight loss.  HENT: Negative for congestion, ear discharge, ear pain, hearing loss, nosebleeds, sinus pain, sore throat and tinnitus.   Eyes: Negative for blurred vision, double vision, photophobia, pain, discharge and redness.  Respiratory: Negative for cough, hemoptysis, sputum production, shortness of breath, wheezing and stridor.   Cardiovascular: Negative for chest pain, palpitations, orthopnea, claudication, leg swelling and PND.  Gastrointestinal: Negative for abdominal pain, blood in stool, constipation, diarrhea, heartburn, melena, nausea and vomiting.  Genitourinary: Negative for dysuria, flank pain, frequency, hematuria and urgency.  Musculoskeletal: Positive for neck pain. Negative for back pain, falls, joint pain and myalgias.  Skin: Negative for itching and rash.       Extensive scarring over his right neck and upper back reportedly form use of home made concoctions in an attempt to cure his cancer.  Neurological: Positive for speech change. Negative for dizziness, tingling, tremors, sensory change, focal weakness, seizures, loss of consciousness, weakness and headaches.       Hoarseness voice since thyroidectomy- unchanged  Endo/Heme/Allergies: Negative for environmental allergies and polydipsia. Does not bruise/bleed easily.  Psychiatric/Behavioral: Negative for depression, hallucinations, memory loss, substance abuse and suicidal ideas. The patient is not nervous/anxious and does not have insomnia.   All other systems reviewed and are negative.  [id number] cancer, hypothyroidism  SOCIAL HISTORY: Not married. Never smoker. Retired Clinical biochemist  FAMILY HISTORY: Noncontributory- two sisters with no known cancers or skin disorders.   Pe: Physical Exam   Constitutional: He is oriented to person, place, and time. He appears well-developed and well-nourished.  HENT:  Head: Normocephalic.  Left Ear: External ear normal.  Mouth/Throat: Oropharynx is clear and moist.  Eyes: Pupils are equal, round, and reactive to light.  Neck:  EXTENSIVE SCARRINg AND CONTRaCTURE of the skin and musculatur eof the left neck  Cardiovascular: Normal rate and regular rhythm.   Pulmonary/Chest: Effort normal and breath sounds normal.  Abdominal: Soft. Bowel sounds are normal.  Musculoskeletal: Normal range of motion.  Except jneck limite dby contractures  Neurological: He is alert and oriented to person, place, and time. No cranial nerve deficit.  Psychiatric: He has a normal mood and affect. His behavior is normal.  He is argumentative and challenging to have a normal conversation.     Result review:  The CT neck with contrast 04/29/2013 scanned into EMR showed an atrophied right trapezius muscle postsurgical changes from total thyroidectomy and prior modified radical right neck dissection. As small soft tissue nodule measuring 1.6 x 1 cm within the thyroid bed and one in the right thyroid lobe area and scattered subcentimeter cervical lymphadenopathy. There is also an additional 0.7 cm nodule in the right tracheoesophageal groove. These images were reportedly compared with the prior CT soft tissue neck from August 2002 and May 2002 and it was reported that these abnormalities remained stable over almost 13 years span.  Per [facility] records, the patient refused imaging and TKI for a while and calcitonin was in the 875 range  Assessment/Plan:  Metastatic meduallary thyroid cancer with stable disease over the years without any conventiomnal treatment since 2011.  He was last seen here by Dr. Grayland Ormond in 2009, and had been seeing by Oncologist at [facility] He wishes to transfer back his care here. He believes that his disease in under control because of a special diet  he follows, However, he does not give me the details of his diet. He remains asymptomatci form thyroid cancer I will request a CEA and a serum calcitonin and LDH levels.  We'll see him back next visit to review these results. If they remain stable no further imaging will be performed. However if they show rise he will require further imaging. Patient agrees to the plan.

## 2016-01-05 NOTE — Progress Notes (Signed)
CC:  Returns to review results ordered at last visit on 12/22/2015 for metastatic medullary cancer of the thryoid.  HPI: Please see my last note for details dated 12/22/2015.  ROS: He denies nay new symptoms today Vitals:   01/05/16 1101  Weight: 156 lb 8.4 oz (71 kg)   Lab review:  His labs shows a normal cbc Normal cmp, ldh, Serum calcitonin is 1411-  Slightly higher than 1321 in 07/2014 CEA  is down to 35.4  Stable compared to 38.2 in 07/2014..  Impression and Plan:  Medulalry thyroid cancer, there is a rise in Calcitonin over the last year,  He was recommended further imaging, but he declines to have any. He wants to  continue to follow up here only to monitor tumor markers but declines to have any thing else. He believes that the special diet he follows has been keeping this disease stable. We will continue to monitor his disease as requested.

## 2016-04-06 ENCOUNTER — Other Ambulatory Visit: Payer: Self-pay | Admitting: *Deleted

## 2016-04-06 ENCOUNTER — Inpatient Hospital Stay: Payer: Medicare PPO | Attending: Hematology and Oncology

## 2016-04-06 DIAGNOSIS — R970 Elevated carcinoembryonic antigen [CEA]: Secondary | ICD-10-CM | POA: Diagnosis not present

## 2016-04-06 DIAGNOSIS — Z9221 Personal history of antineoplastic chemotherapy: Secondary | ICD-10-CM | POA: Diagnosis not present

## 2016-04-06 DIAGNOSIS — R070 Pain in throat: Secondary | ICD-10-CM | POA: Diagnosis not present

## 2016-04-06 DIAGNOSIS — R1310 Dysphagia, unspecified: Secondary | ICD-10-CM | POA: Diagnosis not present

## 2016-04-06 DIAGNOSIS — C73 Malignant neoplasm of thyroid gland: Secondary | ICD-10-CM

## 2016-04-06 DIAGNOSIS — E890 Postprocedural hypothyroidism: Secondary | ICD-10-CM | POA: Diagnosis not present

## 2016-04-06 LAB — COMPREHENSIVE METABOLIC PANEL
ALT: 16 U/L — ABNORMAL LOW (ref 17–63)
AST: 30 U/L (ref 15–41)
Albumin: 4.4 g/dL (ref 3.5–5.0)
Alkaline Phosphatase: 59 U/L (ref 38–126)
Anion gap: 7 (ref 5–15)
BUN: 12 mg/dL (ref 6–20)
CO2: 24 mmol/L (ref 22–32)
Calcium: 9.4 mg/dL (ref 8.9–10.3)
Chloride: 105 mmol/L (ref 101–111)
Creatinine, Ser: 0.8 mg/dL (ref 0.61–1.24)
GFR calc Af Amer: >60 mL/min (ref >60)
GFR calc non Af Amer: >60 mL/min (ref >60)
Glucose, Bld: 126 mg/dL — ABNORMAL HIGH (ref 65–99)
Potassium: 4.2 mmol/L (ref 3.5–5.1)
Sodium: 136 mmol/L (ref 135–145)
Total Bilirubin: 0.5 mg/dL (ref 0.3–1.2)
Total Protein: 7.3 g/dL (ref 6.5–8.1)

## 2016-04-06 LAB — CBC WITH DIFFERENTIAL/PLATELET
Basophils Absolute: 0.1 10*3/uL (ref 0–0.1)
Basophils Relative: 2 %
Eosinophils Absolute: 0.2 10*3/uL (ref 0–0.7)
Eosinophils Relative: 5 %
HCT: 40.8 % (ref 40.0–52.0)
Hemoglobin: 14.4 g/dL (ref 13.0–18.0)
Lymphocytes Relative: 26 %
Lymphs Abs: 1.2 10*3/uL (ref 1.0–3.6)
MCH: 31.5 pg (ref 26.0–34.0)
MCHC: 35.2 g/dL (ref 32.0–36.0)
MCV: 89.6 fL (ref 80.0–100.0)
Monocytes Absolute: 0.4 10*3/uL (ref 0.2–1.0)
Monocytes Relative: 10 %
Neutro Abs: 2.6 10*3/uL (ref 1.4–6.5)
Neutrophils Relative %: 57 %
Platelets: 267 10*3/uL (ref 150–440)
RBC: 4.55 MIL/uL (ref 4.40–5.90)
RDW: 15.6 % — ABNORMAL HIGH (ref 11.5–14.5)
WBC: 4.4 10*3/uL (ref 3.8–10.6)

## 2016-04-06 LAB — LACTATE DEHYDROGENASE: LDH: 153 U/L (ref 98–192)

## 2016-04-07 LAB — THYROID PANEL WITH TSH
Free Thyroxine Index: 1.9 (ref 1.2–4.9)
T3 UPTAKE RATIO: 25 % (ref 24–39)
T4 TOTAL: 7.5 ug/dL (ref 4.5–12.0)
TSH: 0.862 u[IU]/mL (ref 0.450–4.500)

## 2016-04-07 LAB — CALCITONIN: Calcitonin: 1254 pg/mL — ABNORMAL HIGH (ref 0.0–8.4)

## 2016-04-07 LAB — CEA: CEA: 43.9 ng/mL — ABNORMAL HIGH (ref 0.0–4.7)

## 2016-04-14 ENCOUNTER — Inpatient Hospital Stay (HOSPITAL_BASED_OUTPATIENT_CLINIC_OR_DEPARTMENT_OTHER): Payer: Medicare PPO | Admitting: Hematology and Oncology

## 2016-04-14 VITALS — BP 123/76 | HR 86 | Temp 95.9°F | Resp 18 | Wt 155.2 lb

## 2016-04-14 DIAGNOSIS — R070 Pain in throat: Secondary | ICD-10-CM

## 2016-04-14 DIAGNOSIS — C73 Malignant neoplasm of thyroid gland: Secondary | ICD-10-CM | POA: Diagnosis not present

## 2016-04-14 DIAGNOSIS — R970 Elevated carcinoembryonic antigen [CEA]: Secondary | ICD-10-CM | POA: Diagnosis not present

## 2016-04-14 DIAGNOSIS — R1310 Dysphagia, unspecified: Secondary | ICD-10-CM | POA: Diagnosis not present

## 2016-04-14 DIAGNOSIS — Z9221 Personal history of antineoplastic chemotherapy: Secondary | ICD-10-CM

## 2016-04-14 DIAGNOSIS — E890 Postprocedural hypothyroidism: Secondary | ICD-10-CM

## 2016-04-14 NOTE — Progress Notes (Signed)
Patient offers no concerns today. 

## 2016-04-14 NOTE — Progress Notes (Signed)
[place] [facility] day:  04/14/2016  Chief Complaint: Grant Freeman is a 82 y.o. male with medullary thyroid cancer who is seen for reassessment.  HPI: The patient initially presented with a hoarse voice and neck swelling in 08/1999.  He underwent bilateral thyroidectomy 07/06/2000.  Pathology revealed a 5.2 cm medullary carcinoma. Cancer extended up to but not through the thyroid capsule. 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 Right sentinel lymph node was negative. Left central neck lymph node was positive with a small focus of metastatic medullary carcinoma. Two lymph nodes in the left upper mediastinum were negative for cancer and a third paratracheal lymph node was negative.  He received an unknown number of cycles of carboplatin and Taxol beginning in 11/2001.    He developed recurrent disease in 2006. He underwent right neck dissection on 10/04/2004.  There was a 4.2 cm soft tissue mass of recurrent medullary thyroid cancer. Four of 4 right-sided neck nodes were positive.  The largest node was 4.2 cm with extracapsular extension into the soft tissue.  A right neck level IIB dissection revealed 8 of 11 lymph nodes positive for metastatic thyroid cancer. The largest focus was 5 mm with tumor present in perinodal lymphatics.  He received DTIC (dacarbazine) with some improvement in serum markers in 2010.   PET scan on 03/11/2010 revealed increased FDG uptake in the left base of neck evidence of distant metastasis.  Notes indicate additional neck dissection in 2012 for recurrent disease.  He received vandetanib from 05/2009 through 05/17/2010. Initial dose was 300 mg a day.  Treatment was interrupted in 12/2009 secondary to an infection in his arm. It was resumed in early 02/2010 at 200 mg a day. He notes that his tumor marker dramatically improved with therapy.  He received no TKI therapy between 2012 in 2014. He  refused TKIs as well as imaging studies. Calcitonin remained in the 875 range in 03/2012.  In 03/2012, he began applying "blood root" to his neck and ingesting some.  This resulted in erythema and scarring of the right neck.  He was seeing Dr. Oretha Caprice.  PET scan on 10/18/2012 revealed an increase in a 3.34m nodule to 538m but no visible disease in neck, mediastinum or abdomen.  Recommendation was for no vandetanib.  On 12/14/2012, Dr. CaCloyd Stagersecommended against surgery and [facility] PET scan on 04/01/2016 was suboptimal secondary to altered biomechanics after recent food ingestion prior to the scan. There was new retrotracheal, paraesophageal soft tissue nodule without increased metabolic activity, however, the SUV may be falsely lowered secondary to altered biomechanics.There was unchanged subcentimeter pulmonary nodule in the right upper lobe which was below the size threshold for PET imaging.  Neck soft tissue CT scan on 04/01/2016 revealed new 1.2 x 0.9 cm retrotracheal, paraesophageal enhancing soft tissue nodule suspicious for metastatic disease. It does not appear to be associated with definite increased metabolic activity.  There was redemonstration of a 1.6 x 0.9 cm enhancing soft tissue nodules in the right thyroid bed and adjacent to the cricothyroid cartilage.  Given long-term stability, these foci likely represent residual and/or regenerated thyroid tissue.  There was no significant interval change in size of right 6 mm apical pulmonary nodule.  He is no longer using "blood root".  He is trying a different diet.   He has been followed by several physicians over the years:  [facility] Dr JeMarcie Mowers  Dr Glenice Bow ([facility]), Dr. Joya Martyr ([place]), Dr. Ulysees Barns [facility]; [phone]), and Dr. Lennart Pall [facility]; [phone]).  He has followed by Dr. Lind Guest and Marvene Staff, NP, [place] endocrinologist (last 03/10/2016; [phone];  [phone]).   He was last seen by Dr. Ulysees Barns on 04/12/2016.  Calcitonin has been followed:  2,721 on 05/24/2010, 706 on 08/11/2010, 714 on 10/06/2010, 768 on 11/26/2010, 1,013 on 07/14/2011, 893 on 04/03/2012, 956 on 06/04/2012, 653 on 10/01/2012, 967 on0 10/18/2012, 601 on 12/03/2012, 753 and 11/05/2013, 1,121 on 02/06/2014, 833 on 05/06/2014, 1321 on 08/19/2014, 1171 on 03/10/2016, 1411 on 12/22/2015, and 1,254 on 04/06/2016.  CEA has been followed:  43 and 09/16/2009, 40.8 on 10/05/2009, 38.5 on 11/09/2009, 46.6 on 12/16/2009, 36.9 on 01/27/2010, 40.1 on 02/23/2010, 42.1 on 03/25/2010, 47.9 on 04/20/2010, 44.6 on 05/24/2010, 22.2 on 08/11/2010, 15.2 on 10/06/2010, 18.4 on 11/26/2010, 19.4 on 07/14/2011, 23 on 04/03/2012, 22.6 on 10/01/2012, 35.4 on 12/22/2015 and 43.9 on 04/06/2016.  Symptomatically, he notes pain and pressure in his throat.  He describes trouble swallowing.  He feels that the tight in his neck (scarring) represents tumor.  He denies any weight loss.   Past Medical History:  Diagnosis Date  . Cancer ([place])   . Thyroid cancer [facility])     Past Surgical History:  Procedure Laterality Date  . THYROIDECTOMY      No family history on file.  Social History:  reports that he has never smoked. He has quit using smokeless tobacco. He reports that he drinks about 1.2 oz of alcohol per week . He reports that he does not use drugs.  He lives in [place].  The patient is alone today.  Allergies:  Allergies  Allergen Reactions  . Penicillins Hives    Current Medications: Current Outpatient Prescriptions  Medication Sig Dispense Refill  . levothyroxine (SYNTHROID, LEVOTHROID) 112 MCG tablet Take 1 tablet by mouth daily.     No current facility-administered medications for this visit.     Review of Systems:  GENERAL:  Feels"ok".  No fevers, sweats or weight loss. PERFORMANCE STATUS (ECOG):  1 HEENT:  No visual changes, runny nose, sore throat, mouth sores or  tenderness. Lungs: No shortness of breath or cough.  No hemoptysis. Cardiac:  No chest pain, palpitations, orthopnea, or PND. GI:  Difficulty swallowing.  No nausea, vomiting, diarrhea, constipation, melena or hematochezia. GU:  No urgency, frequency, dysuria, or hematuria. Musculoskeletal:  No back pain.  No joint pain.  No muscle tenderness. Extremities:  No pain or swelling. Skin:  No rashes or skin changes. Neuro:  No headache, numbness or weakness, balance or coordination issues. Endocrine:  No diabetes, thyroid issues, hot flashes or night sweats. Psych:  No mood changes, depression or anxiety. Pain:  Pain and pressure sensation in neck. Review of systems:  All other systems reviewed and found to be negative.  Physical Exam: Blood pressure 123/76, pulse 86, temperature (!) 95.9 F (35.5 C), temperature source Tympanic, resp. rate 18, weight 155 lb 3.3 oz (70.4 kg). GENERAL:  Well developed, well nourished, gentleman sitting comfortably in the exam room in no acute distress. MENTAL STATUS:  Alert and oriented to person, place and time. HEAD:  Wearing a cap.  Normocephalic, atraumatic, face symmetric, no Cushingoid features. EYES:  Pupils equal round and reactive to light and accomodation.  No conjunctivitis or scleral icterus. ENT:  Hoarse.  Oropharynx clear without lesion.  Tongue normal. Mucous membranes moist.  NECK:  Significant fibrosis in neck s/p neck  dissection.  Scarring causes some pulling of the right lower lip. RESPIRATORY:  Clear to auscultation without rales, wheezes or rhonchi. CARDIOVASCULAR:  Regular rate and rhythm without murmur, rub or gallop. ABDOMEN:  Soft, non-tender, with active bowel sounds, and no hepatosplenomegaly.  No masses. SKIN:  No rashes, ulcers or lesions. EXTREMITIES: No edema, no skin discoloration or tenderness.  No palpable cords. LYMPH NODES: No palpable cervical, supraclavicular, axillary or inguinal adenopathy  NEUROLOGICAL:  Unremarkable. PSYCH:  Appropriate.   No visits with results within 3 Day(s) from this visit.  Latest known visit with results is:  Appointment on 04/06/2016  Component Date Value Ref Range Status  . Calcitonin 04/06/2016 1254.0* 0.0 - 8.4 pg/mL Final   Comment: (NOTE) [place] ICMA methodology.  Values obtained with different assay methods or kits cannot be used interchangeably. Performed At: [facility] [place], [place] [id number] Lindon Romp MD [id number]   . CEA 04/06/2016 43.9* 0.0 - 4.7 ng/mL Final   Comment: (NOTE)       Roche ECLIA methodology       Nonsmokers  <3.9                                     Smokers     <5.6 Performed At: [facility] [place], [place] [id number] Lindon Romp MD [id number]   . TSH 04/06/2016 0.862  0.450 - 4.500 uIU/mL Final  . T4, Total 04/06/2016 7.5  4.5 - 12.0 ug/dL Final  . T3 Uptake Ratio 04/06/2016 25  24 - 39 % Final  . Free Thyroxine Index 04/06/2016 1.9  1.2 - 4.9 Final   Comment: (NOTE) Performed At: [facility] [place], [place] [id number] Lindon Romp MD [id number]   . LDH 04/06/2016 153  98 - 192 U/L Final    Assessment:  SAHARSH STERLING is a 82 y.o. male with stage IV medullary thyroid cancer s/p medullary thyroid cancer s/p bilateral thyroidectomy 07/06/2000.  Pathology revealed a 5.2 cm medullary carcinoma. Cancer extended up to but not through the thyroid capsule. 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 Right sentinel lymph node was negative. Left central neck lymph node was positive with a small focus of metastatic medullary carcinoma. Two lymph nodes in the left upper mediastinum were negative for cancer and a third paratracheal lymph node was negative.  He received an unknown number cycles of carboplatin and Taxol beginning in 11/2001.  He developed recurrent disease in 2006. He underwent  right neck dissection on 10/04/2004.  There was a 4.2 cm soft tissue mass of recurrent medullary thyroid cancer. Four of 4 right-sided neck nodes were positive.  The largest node was 4.2 cm with extracapsular extension into the soft tissue.  A right neck level IIB dissection revealed 8 of 11 lymph nodes positive for metastatic thyroid cancer. The largest focus was 5 mm with tumor present in perinodal lymphatics.  He received DTIC (dacarbazine) with some improvement in serum markers in 2010.  PET scan on 03/11/2010 revealed increased FDG uptake in the left base of neck evidence of distant metastasis.  Notes indicate additional neck dissection in 2012 for recurrent disease.  He received vandetanib from 05/2009 through 05/17/2010. Treatment was interrupted in 12/2009 secondary to an infection in his arm. Dose was reduced from 300 mg a day to 02/2010 at 200 mg a  day in 02/2010. He believes his tumor marker dramatically improved with therapy.  He received no TKI therapy between 2012 in 2014 secondary to patient refusal. Calcitonin remained in the 875 range in 03/2012.  In 03/2012, he began ingesting and applying "blood root" to his neck.  PET scan on 10/18/2012 revealed an increase in a 3.74m nodule to 563m but no visible disease in neck, mediastinum or abdomen.  Recommendation was for no vandetanib.  PET scan on 04/01/2016 was suboptimal secondary to altered biomechanics after recent food ingestion prior to the scan. There was new retrotracheal, paraesophageal soft tissue nodule without increased metabolic activity (may falsely lower SUV secondary to altered biomechanics).There was unchanged subcentimeter pulmonary nodule in the right upper lobe which was below the size threshold for PET imaging.  Neck soft tissue CT scan on 04/01/2016 revealed new 1.2 x 0.9 cm retrotracheal, paraesophageal enhancing soft tissue nodule suspicious for metastatic disease, but does not appear to be associated with definite  increased metabolic activity.  There was redemonstration of a 1.6 x 0.9 cm enhancing soft tissue nodules in the right thyroid bed and adjacent to the cricothyroid cartilage.  There was no significant interval change in size of right 6 mm apical pulmonary nodule.  He is trying a different diet to control his disease.   Calcitonin has been followed:  2,721 on 05/24/2010, 706 on 08/11/2010, 714 on 10/06/2010, 768 on 11/26/2010, 1,013 on 07/14/2011, 893 on 04/03/2012, 956 on 06/04/2012, 653 on 10/01/2012, 967 on0 10/18/2012, 601 on 12/03/2012, 753 and 11/05/2013, 1,121 on 02/06/2014, 833 on 05/06/2014, 1321 on 08/19/2014, 1171 on 03/10/2016, 1411 on 12/22/2015, and 1,254 on 04/06/2016.  CEA has been followed:  43 and 09/16/2009, 40.8 on 10/05/2009, 38.5 on 11/09/2009, 46.6 on 12/16/2009, 36.9 on 01/27/2010, 40.1 on 02/23/2010, 42.1 on 03/25/2010, 47.9 on 04/20/2010, 44.6 on 05/24/2010, 22.2 on 08/11/2010, 15.2 on 10/06/2010, 18.4 on 11/26/2010, 19.4 on 07/14/2011, 23 on 04/03/2012, 22.6 on 10/01/2012, 35.4 on 12/22/2015 and 43.9 on 04/06/2016.  Symptomatically, he notes pain and pressure in his throat.  He has some dysphagia.  He denies any weight loss.  Plan: 1.  Review extensive medical history, diagnosis, and management of medullary thyroid carcinoma.  Unclear if prior testing for RET proto-oncogene mutations.  Patient denies any prior conversation re: MEN or family members with malignancy.  Discuss recent PET scan and soft tissue neck images.  Patient relates symptoms of dysphagia and pain/pressure in his throat.  He also points to the extensive scarring in his neck as his cancer.  Suspect some of his symptoms may be due to post surgical scarring.  Calcitonin is markedly elevated, but fairly stable in the last 2 years.  Notes indicate that at in 2014, he was not felt to be a good surgical candidate.  Unclear if he has received radiation.  Dips in calcitonin may correlate with surgical debulking.  Unclear  calcitonin levels as time of vandetanib (patient believes indicated response).  Patient insisted repeatedly that he need to start vandetanib as it was the only thing that decreased his markers (calcitonin).  Discussed plan to contact recent physicians at [facility] as systemic treatment not indicated for tumor marker elevation alone in patients with stable or indolent disease.  2.  [facility]es [phone] 3.  RTC in 2 week for MD assessment after conversation with prior providers.   MeLequita AsalMD  04/14/2016

## 2016-04-16 ENCOUNTER — Encounter: Payer: Self-pay | Admitting: Hematology and Oncology

## 2016-05-09 ENCOUNTER — Inpatient Hospital Stay: Payer: Medicare PPO | Admitting: Hematology and Oncology

## 2016-05-09 NOTE — Progress Notes (Deleted)
[place] [facility] day:  05/09/2016   Chief Complaint: Grant Freeman is a 82 y.o. male with medullary thyroid cancer who is seen for 2 week assessment.  HPI: The patient was last seen in the medical oncology clinic on 04/14/2016.  At that time, he was seen for initial assessment.  He noted pain and pressure in his throat.  He had some dysphagia.  He denied any weight loss.  Calcitonin was 1254 and CEA 43.9 on 04/06/2016.     initially presented with a hoarse voice and neck swelling in 08/1999.  He underwent bilateral thyroidectomy 07/06/2000.  Pathology revealed a 5.2 cm medullary carcinoma. Cancer extended up to but not through the thyroid capsule. 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 Right sentinel lymph node was negative. Left central neck lymph node was positive with a small focus of metastatic medullary carcinoma. Two lymph nodes in the left upper mediastinum were negative for cancer and a third paratracheal lymph node was negative.  He received an unknown number of cycles of carboplatin and Taxol beginning in 11/2001.    He developed recurrent disease in 2006. He underwent right neck dissection on 10/04/2004.  There was a 4.2 cm soft tissue mass of recurrent medullary thyroid cancer. Four of 4 right-sided neck nodes were positive.  The largest node was 4.2 cm with extracapsular extension into the soft tissue.  A right neck level IIB dissection revealed 8 of 11 lymph nodes positive for metastatic thyroid cancer. The largest focus was 5 mm with tumor present in perinodal lymphatics.  He received DTIC (dacarbazine) with some improvement in serum markers in 2010.   PET scan on 03/11/2010 revealed increased FDG uptake in the left base of neck evidence of distant metastasis.  Notes indicate additional neck dissection in 2012 for recurrent disease.  He received vandetanib from 05/2009 through 05/17/2010. Initial dose  was 300 mg a day.  Treatment was interrupted in 12/2009 secondary to an infection in his arm. It was resumed in early 02/2010 at 200 mg a day. He notes that his tumor marker dramatically improved with therapy.  He received no TKI therapy between 2012 in 2014. He refused TKIs as well as imaging studies. Calcitonin remained in the 875 range in 03/2012.  In 03/2012, he began applying "blood root" to his neck and ingesting some.  This resulted in erythema and scarring of the right neck.  He was seeing Dr. Oretha Caprice.  PET scan on 10/18/2012 revealed an increase in a 3.49m nodule to 571m but no visible disease in neck, mediastinum or abdomen.  Recommendation was for no vandetanib.  On 12/14/2012, Dr. CaCloyd Stagersecommended against surgery and offered [facility] PET scan on 04/01/2016 was suboptimal secondary to altered biomechanics after recent food ingestion prior to the scan. There was new retrotracheal, paraesophageal soft tissue nodule without increased metabolic activity, however, the SUV may be falsely lowered secondary to altered biomechanics.There was unchanged subcentimeter pulmonary nodule in the right upper lobe which was below the size threshold for PET imaging.  Neck soft tissue CT scan on 04/01/2016 revealed new 1.2 x 0.9 cm retrotracheal, paraesophageal enhancing soft tissue nodule suspicious for metastatic disease. It does not appear to be associated with definite increased metabolic activity.  There was redemonstration of a 1.6 x 0.9 cm enhancing soft tissue nodules in the right thyroid bed and adjacent to the cricothyroid cartilage.  Given long-term stability, these foci likely represent  residual and/or regenerated thyroid tissue.  There was no significant interval change in size of right 6 mm apical pulmonary nodule.  He is no longer using "blood root".  He is trying a different diet.   He has been followed by several physicians over the years:  Dr. Cloyd Stagers [facility]), Dr  Marcie Mowers, Dr Glenice Bow ([facility]), Dr. Joya Martyr ([place]), Dr. Ulysees Barns [facility]; [phone]), and Dr. Lennart Pall [facility]; [phone]).  He has followed by Dr. Lind Guest and Marvene Staff, NP, [place] endocrinologist (last 03/10/2016; [phone]; [phone]).   He was last seen by Dr. Ulysees Barns on 04/12/2016.  Calcitonin has been followed:  2,721 on 05/24/2010, 706 on 08/11/2010, 714 on 10/06/2010, 768 on 11/26/2010, 1,013 on 07/14/2011, 893 on 04/03/2012, 956 on 06/04/2012, 653 on 10/01/2012, 967 on0 10/18/2012, 601 on 12/03/2012, 753 and 11/05/2013, 1,121 on 02/06/2014, 833 on 05/06/2014, 1321 on 08/19/2014, 1171 on 03/10/2016, 1411 on 12/22/2015, and 1,254 on 04/06/2016.  CEA has been followed:  43 and 09/16/2009, 40.8 on 10/05/2009, 38.5 on 11/09/2009, 46.6 on 12/16/2009, 36.9 on 01/27/2010, 40.1 on 02/23/2010, 42.1 on 03/25/2010, 47.9 on 04/20/2010, 44.6 on 05/24/2010, 22.2 on 08/11/2010, 15.2 on 10/06/2010, 18.4 on 11/26/2010, 19.4 on 07/14/2011, 23 on 04/03/2012, 22.6 on 10/01/2012, 35.4 on 12/22/2015 and 43.9 on 04/06/2016.  Symptomatically, he notes pain and pressure in his throat.  He describes trouble swallowing.  He feels that the tight in his neck (scarring) represents tumor.  He denies any weight loss.   Past Medical History:  Diagnosis Date  . Cancer ([place])   . Thyroid cancer [facility])     Past Surgical History:  Procedure Laterality Date  . THYROIDECTOMY      No family history on file.  Social History:  reports that he has never smoked. He has quit using smokeless tobacco. He reports that he drinks about 1.2 oz of alcohol per week . He reports that he does not use drugs.  He lives in [place].  The patient is alone today.  Allergies:  Allergies  Allergen Reactions  . Penicillins Hives    Current Medications: Current Outpatient Prescriptions  Medication Sig Dispense Refill  . levothyroxine (SYNTHROID, LEVOTHROID) 112 MCG tablet Take 1 tablet by mouth  daily.     No current facility-administered medications for this visit.     Review of Systems:  GENERAL:  Feels"ok".  No fevers, sweats or weight loss. PERFORMANCE STATUS (ECOG):  1 HEENT:  No visual changes, runny nose, sore throat, mouth sores or tenderness. Lungs: No shortness of breath or cough.  No hemoptysis. Cardiac:  No chest pain, palpitations, orthopnea, or PND. GI:  Difficulty swallowing.  No nausea, vomiting, diarrhea, constipation, melena or hematochezia. GU:  No urgency, frequency, dysuria, or hematuria. Musculoskeletal:  No back pain.  No joint pain.  No muscle tenderness. Extremities:  No pain or swelling. Skin:  No rashes or skin changes. Neuro:  No headache, numbness or weakness, balance or coordination issues. Endocrine:  No diabetes, thyroid issues, hot flashes or night sweats. Psych:  No mood changes, depression or anxiety. Pain:  Pain and pressure sensation in neck. Review of systems:  All other systems reviewed and found to be negative.  Physical Exam: There were no vitals taken for this visit. GENERAL:  Well developed, well nourished, gentleman sitting comfortably in the exam room in no acute distress. MENTAL STATUS:  Alert and oriented to person, place and time. HEAD:  Wearing a cap.  Normocephalic, atraumatic, face symmetric, no Cushingoid features.  EYES:  Pupils equal round and reactive to light and accomodation.  No conjunctivitis or scleral icterus. ENT:  Hoarse.  Oropharynx clear without lesion.  Tongue normal. Mucous membranes moist.  NECK:  Significant fibrosis in neck s/p neck dissection.  Scarring causes some pulling of the right lower lip. RESPIRATORY:  Clear to auscultation without rales, wheezes or rhonchi. CARDIOVASCULAR:  Regular rate and rhythm without murmur, rub or gallop. ABDOMEN:  Soft, non-tender, with active bowel sounds, and no hepatosplenomegaly.  No masses. SKIN:  No rashes, ulcers or lesions. EXTREMITIES: No edema, no skin  discoloration or tenderness.  No palpable cords. LYMPH NODES: No palpable cervical, supraclavicular, axillary or inguinal adenopathy  NEUROLOGICAL: Unremarkable. PSYCH:  Appropriate.   No visits with results within 3 Day(s) from this visit.  Latest known visit with results is:  Appointment on 04/06/2016  Component Date Value Ref Range Status  . Calcitonin 04/06/2016 1254.0* 0.0 - 8.4 pg/mL Final   Comment: (NOTE) [facility]PC Immulite 2000 ICMA methodology.  Values obtained with different assay methods or kits cannot be used interchangeably. Performed At: [facility] [place] [address] [place], [place] [id number] Hancock William F MD Ph:[phone]   . CEA 04/06/2016 43.9* 0.0 - 4.7 ng/mL Final   Comment: (NOTE)       Roche ECLIA methodology       Nonsmokers  <3.9                                     Smokers     <5.6 Performed At: [facility] [place] [address] [place], [place] [id number] Hancock William F MD Ph:[id number]   . TSH 04/06/2016 0.862  0.450 - 4.500 uIU/mL Final  . T4, Total 04/06/2016 7.5  4.5 - 12.0 ug/dL Final  . T3 Uptake Ratio 04/06/2016 25  24 - 39 % Final  . Free Thyroxine Index 04/06/2016 1.9  1.2 - 4.9 Final   Comment: (NOTE) Performed At: [facility]  [address] , [place] [id number] Hancock William F MD Ph:[phone]   . LDH 04/06/2016 153  98 - 192 U/L Final    Assessment:  Tyrie W Marchuk is a 81 y.o. male with stage IV medullary thyroid cancer s/p medullary thyroid cancer s/p bilateral thyroidectomy 07/06/2000.  Pathology revealed a 5.2 cm medullary carcinoma. Cancer extended up to but not through the thyroid capsule. 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 Right sentinel lymph node was negative. Left central neck lymph node was positive with a small focus of metastatic medullary carcinoma. Two lymph nodes in the left upper mediastinum were negative for cancer and a third paratracheal lymph node was  negative.  He received an unknown number cycles of carboplatin and Taxol beginning in 11/2001.  He developed recurrent disease in 2006. He underwent right neck dissection on 10/04/2004.  There was a 4.2 cm soft tissue mass of recurrent medullary thyroid cancer. Four of 4 right-sided neck nodes were positive.  The largest node was 4.2 cm with extracapsular extension into the soft tissue.  A right neck level IIB dissection revealed 8 of 11 lymph nodes positive for metastatic thyroid cancer. The largest focus was 5 mm with tumor present in perinodal lymphatics.  He received DTIC (dacarbazine) with some improvement in serum markers in 2010.  PET scan on 03/11/2010 revealed increased FDG uptake in the left base of neck evidence of distant metastasis.  Notes indicate additional neck   dissection in 2012 for recurrent disease.  He received vandetanib from 05/2009 through 05/17/2010. Treatment was interrupted in 12/2009 secondary to an infection in his arm. Dose was reduced from 300 mg a day to 02/2010 at 200 mg a day in 02/2010. He believes his tumor marker dramatically improved with therapy.  He received no TKI therapy between 2012 in 2014 secondary to patient refusal. Calcitonin remained in the 875 range in 03/2012.  In 03/2012, he began ingesting and applying "blood root" to his neck.  PET scan on 10/18/2012 revealed an increase in a 3.12m nodule to 532m but no visible disease in neck, mediastinum or abdomen.  Recommendation was for no vandetanib.  PET scan on 04/01/2016 was suboptimal secondary to altered biomechanics after recent food ingestion prior to the scan. There was new retrotracheal, paraesophageal soft tissue nodule without increased metabolic activity (may falsely lower SUV secondary to altered biomechanics).There was unchanged subcentimeter pulmonary nodule in the right upper lobe which was below the size threshold for PET imaging.  Neck soft tissue CT scan on 04/01/2016 revealed new 1.2 x 0.9  cm retrotracheal, paraesophageal enhancing soft tissue nodule suspicious for metastatic disease, but does not appear to be associated with definite increased metabolic activity.  There was redemonstration of a 1.6 x 0.9 cm enhancing soft tissue nodules in the right thyroid bed and adjacent to the cricothyroid cartilage.  There was no significant interval change in size of right 6 mm apical pulmonary nodule.  He is trying a different diet to control his disease.   Calcitonin has been followed:  2,721 on 05/24/2010, 706 on 08/11/2010, 714 on 10/06/2010, 768 on 11/26/2010, 1,013 on 07/14/2011, 893 on 04/03/2012, 956 on 06/04/2012, 653 on 10/01/2012, 967 on0 10/18/2012, 601 on 12/03/2012, 753 and 11/05/2013, 1,121 on 02/06/2014, 833 on 05/06/2014, 1321 on 08/19/2014, 1171 on 03/10/2016, 1411 on 12/22/2015, and 1,254 on 04/06/2016.  CEA has been followed:  43 and 09/16/2009, 40.8 on 10/05/2009, 38.5 on 11/09/2009, 46.6 on 12/16/2009, 36.9 on 01/27/2010, 40.1 on 02/23/2010, 42.1 on 03/25/2010, 47.9 on 04/20/2010, 44.6 on 05/24/2010, 22.2 on 08/11/2010, 15.2 on 10/06/2010, 18.4 on 11/26/2010, 19.4 on 07/14/2011, 23 on 04/03/2012, 22.6 on 10/01/2012, 35.4 on 12/22/2015 and 43.9 on 04/06/2016.  Symptomatically, he notes pain and pressure in his throat.  He has some dysphagia.  He denies any weight loss.  Plan: 1.  Review extensive medical history, diagnosis, and management of medullary thyroid carcinoma.  Unclear if prior testing for RET proto-oncogene mutations.  Patient denies any prior conversation re: MEN or family members with malignancy.  Discuss recent PET scan and soft tissue neck images.  Patient relates symptoms of dysphagia and pain/pressure in his throat.  He also points to the extensive scarring in his neck as his cancer.  Suspect some of his symptoms may be due to post surgical scarring.  Calcitonin is markedly elevated, but fairly stable in the last 2 years.  Notes indicate that at in 2014, he was not  felt to be a good surgical candidate.  Unclear if he has received radiation.  Dips in calcitonin may correlate with surgical debulking.  Unclear calcitonin levels as time of vandetanib (patient believes indicated response).  Patient insisted repeatedly that he need to start vandetanib as it was the only thing that decreased his markers (calcitonin).  Discussed plan to contact recent physicians at [facility] as systemic treatment not indicated for tumor marker elevation alone in patients with stable or indolent disease.  2.  Contact Dr. JeUlysees Barnst  [facility] [phone]). 3.  RTC in 2 week for MD assessment after conversation with prior providers.   Lequita Asal, MD  05/09/2016, 5:59 AM

## 2016-05-20 ENCOUNTER — Encounter: Payer: Self-pay | Admitting: Hematology and Oncology

## 2016-05-20 ENCOUNTER — Inpatient Hospital Stay: Payer: Medicare PPO | Attending: Hematology and Oncology | Admitting: Hematology and Oncology

## 2016-05-20 VITALS — BP 124/68 | HR 83 | Temp 98.0°F | Resp 18 | Wt 161.2 lb

## 2016-05-20 DIAGNOSIS — R1310 Dysphagia, unspecified: Secondary | ICD-10-CM | POA: Diagnosis not present

## 2016-05-20 DIAGNOSIS — Z79899 Other long term (current) drug therapy: Secondary | ICD-10-CM

## 2016-05-20 DIAGNOSIS — E890 Postprocedural hypothyroidism: Secondary | ICD-10-CM

## 2016-05-20 DIAGNOSIS — C73 Malignant neoplasm of thyroid gland: Secondary | ICD-10-CM | POA: Insufficient documentation

## 2016-05-20 NOTE — Progress Notes (Signed)
Patient offers no concerns today. 

## 2016-05-20 NOTE — Progress Notes (Signed)
[place] [facility] day:  05/20/2016   Chief Complaint: Grant Freeman is a 82 y.o. male with medullary thyroid cancer who is seen for 2 week assessment.  HPI: The patient was last seen in the medical oncology clinic on 04/14/2016.  At that time, he was seen for initial assessment.  He noted pain and pressure in his throat.  He had some dysphagia.  He denied any weight loss.  Calcitonin was 1254 and CEA 43.9 on 04/06/2016.   During the interim, he denies any new complaints.  He continues have a sense of pressure in his throat.  He has some mild swallowing issues.  He states that it "comes and goes".  He is not interested in any further surgery.  He would like to start vandetanib as it was the "only medication that made my marker go down".   Past Medical History:  Diagnosis Date  . Cancer ([place])   . Thyroid cancer [facility])     Past Surgical History:  Procedure Laterality Date  . THYROIDECTOMY      History reviewed. No pertinent family history.  Social History:  reports that he has never smoked. He has quit using smokeless tobacco. He reports that he drinks about 1.2 oz of alcohol per week . He reports that he does not use drugs.  He lives in [place].  The patient is alone today.  Allergies:  Allergies  Allergen Reactions  . Penicillins Hives    Current Medications: Current Outpatient Prescriptions  Medication Sig Dispense Refill  . levothyroxine (SYNTHROID, LEVOTHROID) 112 MCG tablet Take 1 tablet by mouth daily.    . Melatonin 10 MG CAPS Take 10 mg by mouth at bedtime.    . saw palmetto 80 MG capsule Take 80 mg by mouth 2 (two) times daily.     No current facility-administered medications for this visit.     Review of Systems:  GENERAL:  Feels"ok".  No fevers or sweats.  Weight gain of 6 pounds. PERFORMANCE STATUS (ECOG):  1 HEENT:  No visual changes, runny nose, sore throat, mouth sores or tenderness. Lungs: No shortness of breath or  cough.  No hemoptysis. Cardiac:  No chest pain, palpitations, orthopnea, or PND. GI:  Difficulty swallowing (see HPI).  No nausea, vomiting, diarrhea, constipation, melena or hematochezia. GU:  No urgency, frequency, dysuria, or hematuria. Musculoskeletal:  No back pain.  No joint pain.  No muscle tenderness. Extremities:  No pain or swelling. Skin:  No rashes or skin changes. Neuro:  No headache, numbness or weakness, balance or coordination issues. Endocrine:  No diabetes, thyroid issues, hot flashes or night sweats. Psych:  No mood changes, depression or anxiety. Pain:  Pain and pressure sensation in neck (stable). Review of systems:  All other systems reviewed and found to be negative.  Physical Exam: Blood pressure 124/68, pulse 83, temperature 98 F (36.7 C), temperature source Tympanic, resp. rate 18, weight 161 lb 3 oz (73.1 kg). GENERAL:  Well developed, well nourished, gentleman sitting comfortably in the exam room in no acute distress. MENTAL STATUS:  Alert and oriented to person, place and time. HEAD:  Wearing a fishing cap.  Blonde hair.  Normocephalic, atraumatic, face symmetric, no Cushingoid features. EYES:  Pupils equal round and reactive to light and accomodation.  No conjunctivitis or scleral icterus. ENT:  Hoarse.  Oropharynx clear without lesion.  Tongue normal. Mucous membranes moist.  NECK:  Significant fibrosis in neck s/p neck dissection.  Scarring  causes some pulling of the right lower lip. RESPIRATORY:  Clear to auscultation without rales, wheezes or rhonchi. CARDIOVASCULAR:  Regular rate and rhythm without murmur, rub or gallop. ABDOMEN:  Soft, non-tender, with active bowel sounds, and no hepatosplenomegaly.  No masses. SKIN:  No rashes, ulcers or lesions. EXTREMITIES: No edema, no skin discoloration or tenderness.  No palpable cords. LYMPH NODES: No palpable cervical, supraclavicular, axillary or inguinal adenopathy  NEUROLOGICAL: Unremarkable. PSYCH:   Appropriate.   No visits with results within 3 Day(s) from this visit.  Latest known visit with results is:  Appointment on 04/06/2016  Component Date Value Ref Range Status  . Calcitonin 04/06/2016 1254.0* 0.0 - 8.4 pg/mL Final   Comment: (NOTE) [place] ICMA methodology.  Values obtained with different assay methods or kits cannot be used interchangeably. Performed At: [facility] [place], [place] [id number] Lindon Romp MD [id number]   . CEA 04/06/2016 43.9* 0.0 - 4.7 ng/mL Final   Comment: (NOTE)       Roche ECLIA methodology       Nonsmokers  <3.9                                     Smokers     <5.6 Performed At: [facility] [place], [place] [id number] Lindon Romp MD [id number]   . TSH 04/06/2016 0.862  0.450 - 4.500 uIU/mL Final  . T4, Total 04/06/2016 7.5  4.5 - 12.0 ug/dL Final  . T3 Uptake Ratio 04/06/2016 25  24 - 39 % Final  . Free Thyroxine Index 04/06/2016 1.9  1.2 - 4.9 Final   Comment: (NOTE) Performed At: [facility] [place], [place] [id number] Lindon Romp MD [id number]   . LDH 04/06/2016 153  98 - 192 U/L Final    Assessment:  Grant Freeman is a 82 y.o. male with stage IV medullary thyroid cancer s/p medullary thyroid cancer s/p bilateral thyroidectomy 07/06/2000.  Pathology revealed a 5.2 cm medullary carcinoma. Cancer extended up to but not through the thyroid capsule. 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 Right sentinel lymph node was negative. Left central neck lymph node was positive with a small focus of metastatic medullary carcinoma. Two lymph nodes in the left upper mediastinum were negative for cancer and a third paratracheal lymph node was negative.  He received an unknown number cycles of carboplatin and Taxol beginning in 11/2001.  He developed recurrent disease in 2006. He underwent right neck dissection  on 10/04/2004.  There was a 4.2 cm soft tissue mass of recurrent medullary thyroid cancer. Four of 4 right-sided neck nodes were positive.  The largest node was 4.2 cm with extracapsular extension into the soft tissue.  A right neck level IIB dissection revealed 8 of 11 lymph nodes positive for metastatic thyroid cancer. The largest focus was 5 mm with tumor present in perinodal lymphatics.  He received DTIC (dacarbazine) with some improvement in serum markers in 2010.  PET scan on 03/11/2010 revealed increased FDG uptake in the left base of neck evidence of distant metastasis.  Notes indicate additional neck dissection in 2012 for recurrent disease.  He received vandetanib from 05/2009 through 05/17/2010. Treatment was interrupted in 12/2009 secondary to an infection in his arm. Dose was reduced from 300 mg a day to 02/2010 at 200 mg a day in 02/2010.  He believes his tumor marker dramatically improved with therapy.  He received no TKI therapy between 2012 in 2014 secondary to patient refusal. Calcitonin remained in the 875 range in 03/2012.  In 03/2012, he began ingesting and applying "blood root" to his neck.  PET scan on 10/18/2012 revealed an increase in a 3.74mm nodule to 41mm, but no visible disease in neck, mediastinum or abdomen.  Recommendation was for no vandetanib.  PET scan on 04/01/2016 was suboptimal secondary to altered biomechanics after recent food ingestion prior to the scan. There was new retrotracheal, paraesophageal soft tissue nodule without increased metabolic activity (may falsely lower SUV secondary to altered biomechanics).There was unchanged subcentimeter pulmonary nodule in the right upper lobe which was below the size threshold for PET imaging.  Neck soft tissue CT scan on 04/01/2016 revealed new 1.2 x 0.9 cm retrotracheal, paraesophageal enhancing soft tissue nodule suspicious for metastatic disease, but does not appear to be associated with definite increased metabolic  activity.  There was redemonstration of a 1.6 x 0.9 cm enhancing soft tissue nodules in the right thyroid bed and adjacent to the cricothyroid cartilage.  There was no significant interval change in size of right 6 mm apical pulmonary nodule.  He is trying a different diet to control his disease.   Calcitonin has been followed:  2,721 on 05/24/2010, 706 on 08/11/2010, 714 on 10/06/2010, 768 on 11/26/2010, 1,013 on 07/14/2011, 893 on 04/03/2012, 956 on 06/04/2012, 653 on 10/01/2012, 967 on 10/18/2012, 601 on 12/03/2012, 753 and 11/05/2013, 1,121 on 02/06/2014, 833 on 05/06/2014, 1321 on 08/19/2014, 1171 on 03/10/2016, 1411 on 12/22/2015, and 1,254 on 04/06/2016.  CEA has been followed:  43 and 09/16/2009, 40.8 on 10/05/2009, 38.5 on 11/09/2009, 46.6 on 12/16/2009, 36.9 on 01/27/2010, 40.1 on 02/23/2010, 42.1 on 03/25/2010, 47.9 on 04/20/2010, 44.6 on 05/24/2010, 22.2 on 08/11/2010, 15.2 on 10/06/2010, 18.4 on 11/26/2010, 19.4 on 07/14/2011, 23 on 04/03/2012, 22.6 on 10/01/2012, 35.4 on 12/22/2015 and 43.9 on 04/06/2016.  Symptomatically, he notes ongoing pain and pressure in his throat.  He has intermittent dysphagia.  He has gained weight.  Plan: 1.  Discuss patient's thoughts about therapy.  He would like to restart vandetanib.  He refuses further surgery.  Will try to speak again with prior treating physicians at [facility].  Patient notes symptoms from his diease, which may be secondary to his prior surgeries and scarring.  Clearly, he has active disease.  Systemic treatment is not indicated for tumor marker elevation alone in patient's with stable or indolent disease.  However, it may be reasonable for a trial of vandetanib to assess symptom improvement and correlation with markers. 2.  Contact Dr. Ulysees Barns at [facility] [phone]; [phone]). 3.  Preauth vandetanib. 4.  RTC for MD assessment, labs (CBC with diff, CMP, CEA, calcitonin) after vandetanib arrival.   Lequita Asal, MD   05/20/2016, 4:21 PM

## 2016-05-25 ENCOUNTER — Telehealth: Payer: Self-pay

## 2016-05-25 NOTE — Telephone Encounter (Signed)
There are no documented calls from our office for this patient.

## 2016-05-25 NOTE — Telephone Encounter (Signed)
Pt states that he had a missed call on yesterday.  He was expecting a call in regards to his medication.  Please call asap.

## 2016-05-26 ENCOUNTER — Other Ambulatory Visit: Payer: Self-pay | Admitting: Hematology and Oncology

## 2016-05-26 DIAGNOSIS — C73 Malignant neoplasm of thyroid gland: Secondary | ICD-10-CM

## 2016-05-26 MED ORDER — VANDETANIB 300 MG PO TABS: 300.0000 mg | ORAL_TABLET | Freq: Every day | ORAL | 0 refills | Status: DC

## 2016-06-01 ENCOUNTER — Telehealth: Payer: Self-pay | Admitting: *Deleted

## 2016-06-01 ENCOUNTER — Other Ambulatory Visit: Payer: Self-pay | Admitting: *Deleted

## 2016-06-01 MED ORDER — HYDROCODONE-ACETAMINOPHEN 5-325 MG PO TABS: 1.0000 | ORAL_TABLET | Freq: Three times a day (TID) | ORAL | 0 refills | Status: DC | PRN

## 2016-06-01 NOTE — Telephone Encounter (Signed)
Patient walked into the clinic today asking for pain medication.  Patient is currently not on any pain medications.  Dr Rogue Bussing prescribed Norco 5/325 one tablet every 8 hours. #30 with no refills. Prescription given to patient and patient signed for it.

## 2016-09-18 NOTE — Progress Notes (Deleted)
[facility] day:  09/18/2016   Chief Complaint: Grant Freeman is a 82 y.o. male with medullary thyroid cancer who is seen for reassessment.  HPI: The patient was last seen in the medical oncology clinic on 05/20/2016.  At that time, he noted ongoing pain and pressure in his throat secondary to radiation fibrosis of his neck.  He had intermittent dysphagia.  He had gained weight.  He wanted to restart vandetanib.  He refused further surgery.  We discussed systemic treatment not indicated for tumor marker elevation alone in stable or indolent disease.  We discussed consideration of a trial of vandetanib to assess symptom improvement and correlation with markers.  I requested additional information from his previous providers.  He was seen by Dr. Lind Guest at [facility] on 09/13/2016.  He was noted to have disease amenable to resection, but adamantly declined. He was insistent on initiation of vandetanib. He was scheduled for follow-up with Dr. Barrington Ellison and repeat PET scan in 09/2016. He was taking levothyroxine 112 g daily for postsurgical hypothyroidism. Hoarseness was stable with no significant swallowing or breathing difficulties. Calcitonin was 1,260.  CEA was 43.5.  TSH was 0.05 and free T4 was 0.93.   Past Medical History:  Diagnosis Date  . Cancer ([place])   . Thyroid cancer [facility])     Past Surgical History:  Procedure Laterality Date  . THYROIDECTOMY      No family history on file.  Social History:  reports that he has never smoked. He has quit using smokeless tobacco. He reports that he drinks about 1.2 oz of alcohol per week . He reports that he does not use drugs.  He lives in [place].  The patient is alone today.  Allergies:  Allergies  Allergen Reactions  . Penicillins Hives    Current Medications: Current Outpatient Prescriptions  Medication Sig Dispense Refill  . HYDROcodone-acetaminophen (NORCO/VICODIN) 5-325 MG tablet Take 1  tablet by mouth every 8 (eight) hours as needed for moderate pain. 30 tablet 0  . levothyroxine (SYNTHROID, LEVOTHROID) 112 MCG tablet Take 1 tablet by mouth daily.    . Melatonin 10 MG CAPS Take 10 mg by mouth at bedtime.    . saw palmetto 80 MG capsule Take 80 mg by mouth 2 (two) times daily.    . vandetanib (CAPRELSA) 300 MG tablet Take 1 tablet (300 mg total) by mouth daily. 30 tablet 0   No current facility-administered medications for this visit.     Review of Systems:  GENERAL:  Feels"ok".  No fevers or sweats.  Weight gain of 6 pounds. PERFORMANCE STATUS (ECOG):  1 HEENT:  No visual changes, runny nose, sore throat, mouth sores or tenderness. Lungs: No shortness of breath or cough.  No hemoptysis. Cardiac:  No chest pain, palpitations, orthopnea, or PND. GI:  Difficulty swallowing (see HPI).  No nausea, vomiting, diarrhea, constipation, melena or hematochezia. GU:  No urgency, frequency, dysuria, or hematuria. Musculoskeletal:  No back pain.  No joint pain.  No muscle tenderness. Extremities:  No pain or swelling. Skin:  No rashes or skin changes. Neuro:  No headache, numbness or weakness, balance or coordination issues. Endocrine:  No diabetes, thyroid issues, hot flashes or night sweats. Psych:  No mood changes, depression or anxiety. Pain:  Pain and pressure sensation in neck (stable). Review of systems:  All other systems reviewed and found to be negative.  Physical Exam: There were no vitals taken for this visit.  GENERAL:  Well developed, well nourished, gentleman sitting comfortably in the exam room in no acute distress. MENTAL STATUS:  Alert and oriented to person, place and time. HEAD:  Wearing a fishing cap.  Blonde hair.  Normocephalic, atraumatic, face symmetric, no Cushingoid features. EYES:  Pupils equal round and reactive to light and accomodation.  No conjunctivitis or scleral icterus. ENT:  Hoarse.  Oropharynx clear without lesion.  Tongue normal. Mucous  membranes moist.  NECK:  Significant fibrosis in neck s/p neck dissection.  Scarring causes some pulling of the right lower lip. RESPIRATORY:  Clear to auscultation without rales, wheezes or rhonchi. CARDIOVASCULAR:  Regular rate and rhythm without murmur, rub or gallop. ABDOMEN:  Soft, non-tender, with active bowel sounds, and no hepatosplenomegaly.  No masses. SKIN:  No rashes, ulcers or lesions. EXTREMITIES: No edema, no skin discoloration or tenderness.  No palpable cords. LYMPH NODES: No palpable cervical, supraclavicular, axillary or inguinal adenopathy  NEUROLOGICAL: Unremarkable. PSYCH:  Appropriate.   No visits with results within 3 Day(s) from this visit.  Latest known visit with results is:  Appointment on 04/06/2016  Component Date Value Ref Range Status  . Calcitonin 04/06/2016 1254.0* 0.0 - 8.4 pg/mL Final   Comment: (NOTE) [place] ICMA methodology.  Values obtained with different assay methods or kits cannot be used interchangeably. Performed At: [facility] [place], [place] [id number] Lindon Romp MD [id number]   . CEA 04/06/2016 43.9* 0.0 - 4.7 ng/mL Final   Comment: (NOTE)       Roche ECLIA methodology       Nonsmokers  <3.9                                     Smokers     <5.6 Performed At: [facility] [place], [place] [id number] Lindon Romp MD [id number]:[id number]   . TSH 04/06/2016 0.862  0.450 - 4.500 uIU/mL Final  . T4, Total 04/06/2016 7.5  4.5 - 12.0 ug/dL Final  . T3 Uptake Ratio 04/06/2016 25  24 - 39 % Final  . Free Thyroxine Index 04/06/2016 1.9  1.2 - 4.9 Final   Comment: (NOTE) Performed At: [facility] [place], [place] [id number] Lindon Romp MD WL:[id number]   . LDH 04/06/2016 153  98 - 192 U/L Final    Assessment:  Grant Freeman is a 82 y.o. male with stage IV medullary thyroid cancer s/p medullary thyroid cancer s/p bilateral thyroidectomy  07/06/2000.  Pathology revealed a 5.2 cm medullary carcinoma. Cancer extended up to but not through the thyroid capsule. 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 Right sentinel lymph node was negative. Left central neck lymph node was positive with a small focus of metastatic medullary carcinoma. Two lymph nodes in the left upper mediastinum were negative for cancer and a third paratracheal lymph node was negative.  He received an unknown number cycles of carboplatin and Taxol beginning in 11/2001.  He developed recurrent disease in 2006. He underwent right neck dissection on 10/04/2004.  There was a 4.2 cm soft tissue mass of recurrent medullary thyroid cancer. Four of 4 right-sided neck nodes were positive.  The largest node was 4.2 cm with extracapsular extension into the soft tissue.  A right neck level IIB dissection revealed 8 of 11 lymph nodes positive for metastatic thyroid cancer. The largest focus was  5 mm with tumor present in perinodal lymphatics.  He received DTIC (dacarbazine) with some improvement in serum markers in 2010.  PET scan on 03/11/2010 revealed increased FDG uptake in the left base of neck evidence of distant metastasis.  Notes indicate additional neck dissection in 2012 for recurrent disease.  He received vandetanib from 05/2009 through 05/17/2010. Treatment was interrupted in 12/2009 secondary to an infection in his arm. Dose was reduced from 300 mg a day to 02/2010 at 200 mg a day in 02/2010. He believes his tumor marker dramatically improved with therapy.  He received no TKI therapy between 2012 in 2014 secondary to patient refusal. Calcitonin remained in the 875 range in 03/2012.  In 03/2012, he began ingesting and applying "blood root" to his neck.  PET scan on 10/18/2012 revealed an increase in a 3.38mm nodule to 71mm, but no visible disease in neck, mediastinum or abdomen.  Recommendation was for no vandetanib.  PET scan on 04/01/2016 was  suboptimal secondary to altered biomechanics after recent food ingestion prior to the scan. There was new retrotracheal, paraesophageal soft tissue nodule without increased metabolic activity (may falsely lower SUV secondary to altered biomechanics).There was unchanged subcentimeter pulmonary nodule in the right upper lobe which was below the size threshold for PET imaging.  Neck soft tissue CT scan on 04/01/2016 revealed new 1.2 x 0.9 cm retrotracheal, paraesophageal enhancing soft tissue nodule suspicious for metastatic disease, but does not appear to be associated with definite increased metabolic activity.  There was redemonstration of a 1.6 x 0.9 cm enhancing soft tissue nodules in the right thyroid bed and adjacent to the cricothyroid cartilage.  There was no significant interval change in size of right 6 mm apical pulmonary nodule.  He is trying a different diet to control his disease.   Calcitonin has been followed:  2,721 on 05/24/2010, 706 on 08/11/2010, 714 on 10/06/2010, 768 on 11/26/2010, 1,013 on 07/14/2011, 893 on 04/03/2012, 956 on 06/04/2012, 653 on 10/01/2012, 967 on 10/18/2012, 601 on 12/03/2012, 753 and 11/05/2013, 1,121 on 02/06/2014, 833 on 05/06/2014, 1321 on 08/19/2014, 1171 on 03/10/2016, 1411 on 12/22/2015, 1,254 on 04/06/2016, and 1260 on 09/13/2016.  CEA has been followed:  43 and 09/16/2009, 40.8 on 10/05/2009, 38.5 on 11/09/2009, 46.6 on 12/16/2009, 36.9 on 01/27/2010, 40.1 on 02/23/2010, 42.1 on 03/25/2010, 47.9 on 04/20/2010, 44.6 on 05/24/2010, 22.2 on 08/11/2010, 15.2 on 10/06/2010, 18.4 on 11/26/2010, 19.4 on 07/14/2011, 23 on 04/03/2012, 22.6 on 10/01/2012, 35.4 on 12/22/2015, 43.9 on 04/06/2016, and 43.5 on 09/13/2016.  Symptomatically, he notes ongoing pain and pressure in his throat.  He has intermittent dysphagia.  He has gained weight.  Plan: 1.  Discuss interm events and evaluation at [facility]. 2.  patient's thoughts about therapy.  He would like to restart  vandetanib.  He refuses further surgery.  Will try to speak again with prior treating physicians at [facility].  Patient notes symptoms from his diease, which may be secondary to his prior surgeries and scarring.  Clearly, he has active disease.  Systemic treatment is not indicated for tumor marker elevation alone in patient's with stable or indolent disease.  However, it may be reasonable for a trial of vandetanib to assess symptom improvement and correlation with markers. 2.  Contact Dr. Ulysees Barns at [facility] [phone]; [phone]). 3.  Preauth vandetanib. 4.  RTC for MD assessment, labs (CBC with diff, CMP, CEA, calcitonin) after vandetanib arrival.   Lequita Asal, MD  09/18/2016, 2:36 PM

## 2016-09-19 ENCOUNTER — Inpatient Hospital Stay: Payer: Medicare PPO | Attending: Hematology and Oncology | Admitting: Hematology and Oncology

## 2016-09-20 ENCOUNTER — Telehealth: Payer: Self-pay | Admitting: Hematology and Oncology

## 2016-10-04 ENCOUNTER — Inpatient Hospital Stay: Payer: Medicare PPO | Attending: Hematology and Oncology | Admitting: Hematology and Oncology

## 2016-10-04 ENCOUNTER — Encounter: Payer: Self-pay | Admitting: Hematology and Oncology

## 2016-10-04 VITALS — BP 100/61 | HR 62 | Temp 97.6°F | Resp 20 | Wt 157.5 lb

## 2016-10-04 DIAGNOSIS — R490 Dysphonia: Secondary | ICD-10-CM | POA: Insufficient documentation

## 2016-10-04 DIAGNOSIS — J3801 Paralysis of vocal cords and larynx, unilateral: Secondary | ICD-10-CM | POA: Insufficient documentation

## 2016-10-04 DIAGNOSIS — Z79899 Other long term (current) drug therapy: Secondary | ICD-10-CM

## 2016-10-04 DIAGNOSIS — C73 Malignant neoplasm of thyroid gland: Secondary | ICD-10-CM | POA: Insufficient documentation

## 2016-10-04 NOTE — Progress Notes (Signed)
Patient here today for follow up regarding thyroid cancer.  Wants to start on chemo pill.  Patient states he had PET at [facility] last week.

## 2016-10-04 NOTE — Progress Notes (Signed)
[facility] day:  10/04/2016   Chief Complaint: Grant Freeman is a 82 y.o. male with medullary thyroid cancer who is seen for reassessment.  HPI: The patient was last seen in the medical oncology clinic on 05/20/2016.  At that time, he noted ongoing pain and pressure in his throat secondary to fibrosis of his neck.  He had intermittent dysphagia.  He had gained weight.  He wanted to restart vandetanib.  He refused further surgery.  We discussed systemic treatment not indicated for tumor marker elevation alone in stable or indolent disease.  We discussed consideration of a trial of vandetanib to assess symptom improvement and correlation with markers.  I requested additional information from his previous providers.  He was seen by Dr. Lind Guest at [facility] on 09/13/2016.  He was noted to have disease amenable to resection, but adamantly declined. He was insistent on initiation of vandetanib. He was scheduled for follow-up with Dr. Barrington Ellison and repeat PET scan in 09/2016. He was taking levothyroxine 112 g daily for postsurgical hypothyroidism. Hoarseness was stable with no significant swallowing or breathing difficulties. Calcitonin was 1,260.  CEA was 43.5.  TSH was 0.05 and free T4 was 0.93.  PET scan at [facility] on 09/30/2016 revealed redemonstrated minimally FDG avid retrotracheal, paraesophageal soft tissue with limited comparison to prior study given previous muscle uptake. This remained concerning for possible metastatic disease. There was unchanged CT appearance of enhancing cricothyroid soft tissue also with mild FDG avidity, likely ectopic thyroid tissue or level VI lymph nodes;  metastatic disease was not excluded. There was no evidence of new FDG avid disease.  Diagnostic neck CT on 09/30/2016 revealed stable appearance of right retrotracheal, paraesophageal enhancing soft tissue nodule (1.3 x 0.9 cm compared to 1.2 x 0.9 cm). This lesion was suspicious  for metastatic disease. Attention on follow-up.  There was redemonstration of extensive enhancing soft tissue nodules in the right thyroid bed and adjacent the cricothyroid cartilage which were unchanged in size and may represent residual or regenerated thyroid tissue.  There was opacification of the maxillary, ethmoid and frontal sinuses which may be seen in the setting of sinusitis.  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   Symptomatically, he notes ongoing discomfort in his neck.  He feels "pain and pressure in my neck".  He states that he is concerned about being paraplegic.He denies any trouble swallowing.  He continues to insist that "the pill" (vandetenib) helped him out before.  He believes that he can be cured by the pill and we are keeping it from him.  He believes that it is a "conspiracy".  He believes that treatment is being held secondary to "money in doing surgery".  He comments that "we are going to hear from his lawyer".   Past Medical History:  Diagnosis Date  . Cancer ([place])   . Thyroid cancer [facility])     Past Surgical History:  Procedure Laterality Date  . THYROIDECTOMY      History reviewed. No pertinent family history.  Social History:  reports that he has never smoked. He has quit using smokeless tobacco. He reports that he drinks about 1.2 oz of alcohol per week . He reports that he does not use drugs.  He lives in [place].  The patient is accompanied by Overton Mam, nurse practitioner,  today.  Allergies:  Allergies  Allergen Reactions  . Penicillins Hives    Current Medications: Current Outpatient Prescriptions  Medication  Sig Dispense Refill  . HYDROcodone-acetaminophen (NORCO/VICODIN) 5-325 MG tablet Take 1 tablet by mouth every 8 (eight) hours as needed for moderate pain. 30 tablet 0  . levothyroxine (SYNTHROID, LEVOTHROID) 112 MCG tablet Take 1 tablet by mouth daily.    . Melatonin 10 MG CAPS Take 10 mg by mouth at bedtime.    . saw palmetto 80 MG  capsule Take 80 mg by mouth 2 (two) times daily.    . vandetanib (CAPRELSA) 300 MG tablet Take 1 tablet (300 mg total) by mouth daily. (Patient not taking: Reported on 10/04/2016) 30 tablet 0   No current facility-administered medications for this visit.     Review of Systems:  GENERAL:  Feels "the same".  No fevers or sweats.  Weight down 4 pounds since last visit. PERFORMANCE STATUS (ECOG):  1 HEENT:  No visual changes, runny nose, sore throat, mouth sores or tenderness. Lungs: No shortness of breath or cough.  No hemoptysis. Cardiac:  No chest pain, palpitations, orthopnea, or PND. GI:  No nausea, vomiting, diarrhea, constipation, melena or hematochezia. GU:  No urgency, frequency, dysuria, or hematuria. Musculoskeletal:  No back pain.  No joint pain.  No muscle tenderness. Extremities:  No pain or swelling. Skin:  No rashes or skin changes. Neuro:  No headache, numbness or weakness, balance or coordination issues. Endocrine:  No diabetes.  Medullary thyroid cancer s/p thyroidectomy.  No hot flashes or night sweats. Psych:  No mood changes, depression or anxiety. Pain:  Pain and pressure sensation in neck. Review of systems:  All other systems reviewed and found to be negative.  Physical Exam: Blood pressure 100/61, pulse 62, temperature 97.6 F (36.4 C), temperature source Tympanic, resp. rate 20, weight 157 lb 8 oz (71.4 kg). GENERAL:  Well developed, well nourished, gentleman sitting comfortably in the exam room in no acute distress. MENTAL STATUS:  Alert and oriented to person, place and time. HEAD:  Wearing a fishing cap.  Blonde hair.  Normocephalic, atraumatic, face symmetric, no Cushingoid features. EYES:  Pupils equal round and reactive to light and accomodation.  No conjunctivitis or scleral icterus. ENT:  Hoarse.  Oropharynx clear without lesion.  Tongue normal. Mucous membranes moist.  NECK:  Significant fibrosis in neck s/p neck dissection.  Scarring causes some pulling  down of the right lower lip. RESPIRATORY:  Clear to auscultation without rales, wheezes or rhonchi. CARDIOVASCULAR:  Regular rate and rhythm without murmur, rub or gallop. ABDOMEN:  Soft, non-tender, with active bowel sounds, and no hepatosplenomegaly.  No masses. SKIN:  No rashes, ulcers or lesions. EXTREMITIES: No edema, no skin discoloration or tenderness.  No palpable cords. LYMPH NODES: No palpable cervical, supraclavicular, axillary or inguinal adenopathy  NEUROLOGICAL: Unremarkable. PSYCH:  Pressured speach.  Agitated.   No visits with results within 3 Day(s) from this visit.  Latest known visit with results is:  Appointment on 04/06/2016  Component Date Value Ref Range Status  . Calcitonin 04/06/2016 1254.0* 0.0 - 8.4 pg/mL Final   Comment: (NOTE) [place] ICMA methodology.  Values obtained with different assay methods or kits cannot be used interchangeably. Performed At: [facility] [place], [place] [id number] Lindon Romp MD IR:[id number]   . CEA 04/06/2016 43.9* 0.0 - 4.7 ng/mL Final   Comment: (NOTE)       Roche ECLIA methodology       Nonsmokers  <3.9  Smokers     <5.6 Performed At: [facility] [place], [place] [id number] Lindon Romp MD NF:[id number]   . TSH 04/06/2016 0.862  0.450 - 4.500 uIU/mL Final  . T4, Total 04/06/2016 7.5  4.5 - 12.0 ug/dL Final  . T3 Uptake Ratio 04/06/2016 25  24 - 39 % Final  . Free Thyroxine Index 04/06/2016 1.9  1.2 - 4.9 Final   Comment: (NOTE) Performed At: [facility] [place], [place] [id number] Lindon Romp MD UX:[id number]   . LDH 04/06/2016 153  98 - 192 U/L Final    Assessment:  Grant Freeman is a 82 y.o. male with stage IV medullary thyroid cancer s/p medullary thyroid cancer s/p bilateral thyroidectomy 07/06/2000.  Pathology revealed a 5.2 cm medullary carcinoma. Cancer extended up to but not  through the thyroid capsule. 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 Right sentinel lymph node was negative. Left central neck lymph node was positive with a small focus of metastatic medullary carcinoma. Two lymph nodes in the left upper mediastinum were negative for cancer and a third paratracheal lymph node was negative.  He received an unknown number cycles of carboplatin and Taxol beginning in 11/2001.  He developed recurrent disease in 2006. He underwent right neck dissection on 10/04/2004.  There was a 4.2 cm soft tissue mass of recurrent medullary thyroid cancer. Four of 4 right-sided neck nodes were positive.  The largest node was 4.2 cm with extracapsular extension into the soft tissue.  A right neck level IIB dissection revealed 8 of 11 lymph nodes positive for metastatic thyroid cancer. The largest focus was 5 mm with tumor present in perinodal lymphatics.  He received DTIC (dacarbazine) with some improvement in serum markers in 2010.  PET scan on 03/11/2010 revealed increased FDG uptake in the left base of neck evidence of distant metastasis.  Notes indicate additional neck dissection in 2012 for recurrent disease.  He received vandetanib from 05/2009 through 05/17/2010. Treatment was interrupted in 12/2009 secondary to an infection in his arm. Dose was reduced from 300 mg a day to 02/2010 at 200 mg a day in 02/2010. He believes his tumor marker dramatically improved with therapy.  He received no TKI therapy between 2012 in 2014 secondary to patient refusal. Calcitonin remained in the 875 range in 03/2012.  In 03/2012, he began ingesting and applying "blood root" to his neck.  PET scan on 10/18/2012 revealed an increase in a 3.66mm nodule to 1mm, but no visible disease in neck, mediastinum or abdomen.  Recommendation was for no vandetanib.  PET scan on 04/01/2016 was suboptimal secondary to altered biomechanics after recent food ingestion prior to the scan.  There was new retrotracheal, paraesophageal soft tissue nodule without increased metabolic activity (may falsely lower SUV secondary to altered biomechanics).There was unchanged subcentimeter pulmonary nodule in the right upper lobe which was below the size threshold for PET imaging.  Neck soft tissue CT scan on 04/01/2016 revealed new 1.2 x 0.9 cm retrotracheal, paraesophageal enhancing soft tissue nodule suspicious for metastatic disease, but does not appear to be associated with definite increased metabolic activity.  There was redemonstration of a 1.6 x 0.9 cm enhancing soft tissue nodules in the right thyroid bed and adjacent to the cricothyroid cartilage.  There was no significant interval change in size of right 6 mm apical pulmonary nodule.  PET scan at [facility] on 09/30/2016 revealed redemonstrated minimally FDG avid retrotracheal, paraesophageal soft tissue with  limited comparison to prior study given previous muscle uptake. This remained concerning for possible metastatic disease. There was unchanged CT appearance of enhancing cricothyroid soft tissue also with mild FDG avidity, likely ectopic thyroid tissue or level VI lymph nodes;  metastatic disease was not excluded. There was no evidence of new FDG avid disease.  Diagnostic neck CT on 09/30/2016 revealed stable appearance of right retrotracheal, paraesophageal enhancing soft tissue nodule (1.3 x 0.9 cm compared to 1.2 x 0.9 cm). This lesion was suspicious for metastatic disease. Attention on follow-up.  There was redemonstration of extensive enhancing soft tissue nodules in the right thyroid bed and adjacent the cricothyroid cartilage which were unchanged in size and may represent residual or regenerated thyroid tissue.  There was opacification of the maxillary, ethmoid and frontal sinuses which may be seen in the setting of sinusitis.  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   Calcitonin has been followed:  2,721 on 05/24/2010, 706 on  08/11/2010, 714 on 10/06/2010, 768 on 11/26/2010, 1,013 on 07/14/2011, 893 on 04/03/2012, 956 on 06/04/2012, 653 on 10/01/2012, 967 on 10/18/2012, 601 on 12/03/2012, 753 and 11/05/2013, 1,121 on 02/06/2014, 833 on 05/06/2014, 1321 on 08/19/2014, 1171 on 03/10/2016, 1411 on 12/22/2015, 1,254 on 04/06/2016, and 1260 on 09/13/2016.  CEA has been followed:  43 and 09/16/2009, 40.8 on 10/05/2009, 38.5 on 11/09/2009, 46.6 on 12/16/2009, 36.9 on 01/27/2010, 40.1 on 02/23/2010, 42.1 on 03/25/2010, 47.9 on 04/20/2010, 44.6 on 05/24/2010, 22.2 on 08/11/2010, 15.2 on 10/06/2010, 18.4 on 11/26/2010, 19.4 on 07/14/2011, 23 on 04/03/2012, 22.6 on 10/01/2012, 35.4 on 12/22/2015, 43.9 on 04/06/2016, and 43.5 on 09/13/2016.  Symptomatically, he notes ongoing pain and pressure in his throat.  He wishes to begin vandetanib.  Plan: 1.  Discuss interm events and evaluation at [facility]. 2.  Discuss recent PET scan at [facility].  Imaging appears to be stable.  Discuss prior discussions with patient involving current indolent disease and apparent no indication for treatment.  Discuss serial CEAs and calcitonin level.  Level is stable and per prior conversations with Dr. Barrington Ellison, does not reflect disease.  Discuss right vocal cord paralysis (old).  Discuss no concern for paraplegia.  Discuss prior history of vandetanib use (patient disagrees).  Discuss making good decisions for him regarding his treatment.  Patient constantly states that there is a conspiracy and "we will hear from his lawyer".  Discuss coordinating care with Dr. Barrington Ellison.  Suspect his neck fibrosis is the etiology of his discomfort, but could consider a trial of vandetanib to assess response/symptom improvement.  Approximately 40 minutes were spent talking to the patient today. 3.  Await call back from Dr. Ulysees Barns at [facility] (message left). 4.  Contact patient for follow-up [phone]).   Lequita Asal, MD  10/04/2016, 9:30 AM

## 2019-10-28 NOTE — Progress Notes (Signed)
[facility]  [address] [place], [place] [address] Phone: [phone]  Fax: [phone]   Clinic Day:  10/29/2019  Referring physician: Leonel Ramsay, MD  Chief Complaint: Grant Freeman is a 85 y.o. male with medullary thyroid cancer who is referred in consultation with Dr. Adrian Prows for assessment and management.   HPI: The patient was last seen in the medical oncology clinic on 10/04/2016. At that time, he noted ongoing discomfort in his neck. The patient declined surgery and was insistent on initiation of vandetanib. The patient was scheduled to obtain a repeat PET scan but was lost to follow up.  He was seen on 04/20/2018 by endocrinology at [facility] with Dr. Lind Guest [phone]) to re-establish care.  Labs (CMP, calcitonin, CEA) and scans (PET with neck and chest CT with contrast) were ordered.  He was referred to head and neck oncology.  He continued Synthroid 112 mcg/day.  Follow-up was planned around 07/19/2018.  Labs on 10/16/2019 revealed a hematocrit of 43.2, hemoglobin 14.6, platelets 244,000, WBC 5,000. CMP was normal. TSH was 0.025 and free T4 was 1.25.  CEA was 69.4 on 04/20/2018 and 73.7 on 10/16/2019.  Calcitonin was 2,000 on 04/20/2018 and 4,831 on 10/16/2019.  He was seen by Dr. Adrian Prows on 10/16/2019 to establish care.  He had a rash on his left leg and a boil in the rectal area. He was felt to have eczematous dermatitis and started on triamcinolone and vaseline.  He was referred to surgery for evalution of the rectal lesion. He is seeing the surgeon tomorrow.  Symptomatically, he "could be better".  He states that the cancer is causing him to lose pigmentation on the right side of his face. His voice has been hoarse for 3-4 months but he has not seen an ENT. He declines an ENT referral.  The patient denies fevers, sweats, headaches, changes in vision, runny nose, sore throat, trouble swallowing, cough,  shortness of breath, chest pain, palpitations, nausea, vomiting, diarrhea, reflux, urinary symptoms, bone or joint symptoms, numbness, weakness, balance or coordination problems, and bleeding of any kind.  The patient has been consuming baking powder and maple syrup every other day, which he believes is the reason why he has not passed away from his cancer yet. He got the recipe off of the Internet a long time ago. He states that the cancer migrated to his arms and legs and he was able to kill it by using "blood root".   Past Medical History:  Diagnosis Date  . Cancer ([place])   . Thyroid cancer [facility])     Past Surgical History:  Procedure Laterality Date  . THYROIDECTOMY      No family history on file.  Social History:  reports that he has never smoked. He has quit using smokeless tobacco. He reports current alcohol use of about 2.0 standard drinks of alcohol per week. He reports that he does not use drugs. The patient drinks beer or wine a couple times per weeks. Denies tobacco or drug use. He still lives in [place]. He is a retired Clinical biochemist. The patient is alone today.  Allergies:  Allergies  Allergen Reactions  . Penicillins Hives    Current Medications: Current Outpatient Medications  Medication Sig Dispense Refill  . levothyroxine (SYNTHROID, LEVOTHROID) 112 MCG tablet Take 1 tablet by mouth daily.    Marland Kitchen MELATONIN PO Take by mouth.    Marland Kitchen acetaminophen (TYLENOL) 500 MG tablet Take by mouth. (Patient not  taking: Reported on 10/29/2019)    . HYDROcodone-acetaminophen (NORCO/VICODIN) 5-325 MG tablet Take 1 tablet by mouth every 8 (eight) hours as needed for moderate pain. (Patient not taking: Reported on 10/29/2019) 30 tablet 0  . hydrOXYzine (VISTARIL) 25 MG capsule  (Patient not taking: Reported on 10/29/2019)    . saw palmetto 80 MG capsule Take 80 mg by mouth 2 (two) times daily. (Patient not taking: Reported on 10/29/2019)    . sulfamethoxazole-trimethoprim (BACTRIM DS) 800-160  MG tablet  (Patient not taking: Reported on 10/29/2019)    . vandetanib (CAPRELSA) 300 MG tablet Take 1 tablet (300 mg total) by mouth daily. (Patient not taking: Reported on 10/29/2019) 30 tablet 0   No current facility-administered medications for this visit.    Review of Systems  Constitutional: Negative for chills, diaphoresis, fever, malaise/fatigue and weight loss.       "Could be better."  HENT: Negative for congestion, ear discharge, ear pain, hearing loss, nosebleeds, sinus pain, sore throat and tinnitus.        Hoarse voice x 3-4 months  Eyes: Negative for blurred vision.  Respiratory: Negative for cough, hemoptysis, sputum production and shortness of breath.   Cardiovascular: Negative for chest pain, palpitations and leg swelling.  Gastrointestinal: Negative for abdominal pain, blood in stool, constipation, diarrhea, heartburn, melena, nausea and vomiting.  Genitourinary: Negative for dysuria, frequency, hematuria and urgency.  Musculoskeletal: Negative for back pain, joint pain, myalgias and neck pain.  Skin: Negative for itching and rash.       Losing pigmentation on the right side of his face and neck.  Neurological: Negative for dizziness, tingling, sensory change, weakness and headaches.  Endo/Heme/Allergies: Does not bruise/bleed easily.  Psychiatric/Behavioral: Negative for depression and memory loss. The patient is not nervous/anxious and does not have insomnia.   All other systems reviewed and are negative.  Performance status (ECOG): 1  Vitals Blood pressure 119/82, pulse 95, temperature 98.2 F (36.8 C), temperature source Tympanic, resp. rate 18, weight 171 lb 15.3 oz (78 kg), SpO2 97 %.   Physical Exam Vitals and nursing note reviewed.  Constitutional:      General: He is not in acute distress.    Appearance: He is not diaphoretic.  HENT:     Head: Normocephalic and atraumatic.     Mouth/Throat:     Mouth: Mucous membranes are moist.     Pharynx:  Oropharynx is clear.  Eyes:     General: No scleral icterus.    Extraocular Movements: Extraocular movements intact.     Conjunctiva/sclera: Conjunctivae normal.     Pupils: Pupils are equal, round, and reactive to light.  Cardiovascular:     Rate and Rhythm: Normal rate and regular rhythm.     Heart sounds: Normal heart sounds. No murmur heard.   Pulmonary:     Effort: Pulmonary effort is normal. No respiratory distress.     Breath sounds: Normal breath sounds. No wheezing or rales.  Chest:     Chest wall: No tenderness.  Abdominal:     General: Bowel sounds are normal. There is no distension.     Palpations: Abdomen is soft. There is no mass.     Tenderness: There is no abdominal tenderness. There is no guarding or rebound.  Musculoskeletal:        General: No swelling or tenderness. Normal range of motion.     Cervical back: Normal range of motion and neck supple.  Lymphadenopathy:     Head:  Right side of head: No preauricular, posterior auricular or occipital adenopathy.     Left side of head: No preauricular, posterior auricular or occipital adenopathy.     Cervical: No cervical adenopathy.     Upper Body:     Right upper body: No supraclavicular or axillary adenopathy.     Left upper body: No supraclavicular or axillary adenopathy.     Lower Body: No right inguinal adenopathy. No left inguinal adenopathy.  Skin:    General: Skin is warm and dry.     Comments: Scarring on arms and neck (right > left).  Neurological:     Mental Status: He is alert and oriented to person, place, and time.  Psychiatric:        Behavior: Behavior normal.        Thought Content: Thought content normal.        Judgment: Judgment normal.    10/29/2019:     No visits with results within 3 Day(s) from this visit.  Latest known visit with results is:  Appointment on 04/06/2016  Component Date Value Ref Range Status  . Calcitonin 04/06/2016 1,254.0* 0.0 - 8.4 pg/mL Final   Comment:  (NOTE) [place] ICMA methodology.  Values obtained with different assay methods or kits cannot be used interchangeably. Performed At: [facility] [place], [place] [id number] Lindon Romp MD [id number]:[id number]   . CEA 04/06/2016 43.9* 0.0 - 4.7 ng/mL Final   Comment: (NOTE)       Roche ECLIA methodology       Nonsmokers  <3.9                                     Smokers     <5.6 Performed At: [facility] [place], [place] [id number] Lindon Romp MD [id number]   . TSH 04/06/2016 0.862  0.450 - 4.500 uIU/mL Final  . T4, Total 04/06/2016 7.5  4.5 - 12.0 ug/dL Final  . T3 Uptake Ratio 04/06/2016 25  24 - 39 % Final  . Free Thyroxine Index 04/06/2016 1.9  1.2 - 4.9 Final   Comment: (NOTE) Performed At: [facility] [place], [place] [id number] Lindon Romp MD [id number]:[id number]   . LDH 04/06/2016 153  98 - 192 U/L Final    Assessment:  KHUSH PASION is a 85 y.o. male with stage IV medullary thyroid cancer s/p medullary thyroid cancer s/p bilateral thyroidectomy 07/06/2000.  Pathology revealed a 5.2 cm medullary carcinoma. Cancer extended up to but not through the thyroid capsule. 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 Right sentinel lymph node was negative. Left central neck lymph node was positive with a small focus of metastatic medullary carcinoma. Two lymph nodes in the left upper mediastinum were negative for cancer and a third paratracheal lymph node was negative.  He received an unknown number cycles of carboplatin and Taxol beginning in 11/2001.  He developed recurrent disease in 2006. He underwent right neck dissection on 10/04/2004.  There was a 4.2 cm soft tissue mass of recurrent medullary thyroid cancer. Four of 4 right-sided neck nodes were positive.  The largest node was 4.2 cm with extracapsular extension into the soft tissue.  A right neck level IIB  dissection revealed 8 of 11 lymph nodes positive for metastatic thyroid cancer. The largest focus was 5 mm with tumor present in  perinodal lymphatics.  He received DTIC (dacarbazine) with some improvement in serum markers in 2010.  PET scan on 03/11/2010 revealed increased FDG uptake in the left base of neck evidence of distant metastasis.  Notes indicate additional neck dissection in 2012 for recurrent disease.  He received vandetanib from 05/2009 through 05/17/2010. Treatment was interrupted in 12/2009 secondary to an infection in his arm. Dose was reduced from 300 mg a day to 02/2010 at 200 mg a day in 02/2010. He believes his tumor marker dramatically improved with therapy.  He received no TKI therapy between 2012 in 2014 secondary to patient refusal. Calcitonin remained in the 875 range in 03/2012.  In 03/2012, he began ingesting and applying "blood root" to his neck.  PET scan on 10/18/2012 revealed an increase in a 3.42mm nodule to 54mm, but no visible disease in neck, mediastinum or abdomen.  Recommendation was for no vandetanib.  PET scan on 04/01/2016 was suboptimal secondary to altered biomechanics after recent food ingestion prior to the scan. There was new retrotracheal, paraesophageal soft tissue nodule without increased metabolic activity (may falsely lower SUV secondary to altered biomechanics).There was unchanged subcentimeter pulmonary nodule in the right upper lobe which was below the size threshold for PET imaging.  Neck soft tissue CT scan on 04/01/2016 revealed new 1.2 x 0.9 cm retrotracheal, paraesophageal enhancing soft tissue nodule suspicious for metastatic disease, but does not appear to be associated with definite increased metabolic activity.  There was redemonstration of a 1.6 x 0.9 cm enhancing soft tissue nodules in the right thyroid bed and adjacent to the cricothyroid cartilage.  There was no significant interval change in size of right 6 mm apical pulmonary  nodule.  [facility] scan [facility] on 09/30/2016 revealed redemonstrated minimally FDG avid retrotracheal, paraesophageal soft tissue with limited comparison to prior study given previous muscle uptake. This remained concerning for possible metastatic disease. There was unchanged CT appearance of enhancing cricothyroid soft tissue also with mild FDG avidity, likely ectopic thyroid tissue or level VI lymph nodes;  metastatic disease was not excluded. There was no evidence of new FDG avid disease.  Diagnostic neck CT on 09/30/2016 revealed stable appearance of right retrotracheal, paraesophageal enhancing soft tissue nodule (1.3 x 0.9 cm compared to 1.2 x 0.9 cm). This lesion was suspicious for metastatic disease. Attention on follow-up.  There was redemonstration of extensive enhancing soft tissue nodules in the right thyroid bed and adjacent the cricothyroid cartilage which were unchanged in size and may represent residual or regenerated thyroid tissue.  There was opacification of the maxillary, ethmoid and frontal sinuses which may be seen in the setting of sinusitis.  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   Calcitonin has been followed:  2,721 on 05/24/2010, 706 on 08/11/2010, 714 on 10/06/2010, 768 on 11/26/2010, 1,013 on 07/14/2011, 893 on 04/03/2012, 956 on 06/04/2012, 653 on 10/01/2012, 967 on 10/18/2012, 601 on 12/03/2012, 753 and 11/05/2013, 1,121 on 02/06/2014, 833 on 05/06/2014, 1321 on 08/19/2014, 1171 on 03/10/2016, 1411 on 12/22/2015, 1,254 on 04/06/2016, 1260 on 09/13/2016, 2000 on 04/20/2018, and 73.7 on 10/16/2019.  CEA has been followed:  43 and 09/16/2009, 40.8 on 10/05/2009, 38.5 on 11/09/2009, 46.6 on 12/16/2009, 36.9 on 01/27/2010, 40.1 on 02/23/2010, 42.1 on 03/25/2010, 47.9 on 04/20/2010, 44.6 on 05/24/2010, 22.2 on 08/11/2010, 15.2 on 10/06/2010, 18.4 on 11/26/2010, 19.4 on 07/14/2011, 23 on 04/03/2012, 22.6 on 10/01/2012, 35.4 on 12/22/2015, 43.9 on 04/06/2016, 43.5 on 09/13/2016,  69.4 on 04/20/2018, and  73.7 on 10/16/2019.  The patient does not plan to receive the COVID-19 vaccine.  Symptomatically, he notes loss of pigmentation and scarring on his neck and arms that he feels is related to medullary thyroid carcinoma. He is currently taking maple syrup and baking powder. Exam reveals significant neck scarring and hypopigmentation felt secondary to prior treatment (neck dissection) and possibly topical blood root.  Blood root was applied to areas of skin involved.  Plan: 1.   Review labs since 10/04/2016. 2.   Medullary thyroid carcinoma  Clinically, his exam is similar to 2018.  His clinical course has been indolent and has not required vandetanib (last received 2012).  He has never received radiation.  He has undergone neck dissection for recurrent disease  Review interval calcitonin and CEA levels.  Discuss plan for imaging (CT of neck and PET scan).   Patient in agreement.  Present at tumor board. 3.   Neck CT on 11/06/2019. 4.   PET scan on 11/06/2019. 5.   Tumor board on 11/07/2019. 5.   RTC after imaging for MD assess and discussion regarding direction of therapy.  I discussed the assessment and treatment plan with the patient.  The patient was provided an opportunity to ask questions and all were answered.  The patient agreed with the plan and demonstrated an understanding of the instructions.  The patient was advised to call back if the symptoms worsen or if the condition fails to improve as anticipated.  I provided 33 minutes of face-to-face time during this this encounter and > 50% was spent counseling as documented under my assessment and plan. An additional 15 minutes were spent reviewing his chart (Epic and Care Everywhere) including notes, labs, and imaging studies.    Crews Mccollam C. Mike Gip, MD, PhD    10/29/2019, 3:18 PM  I, Mirian Mo Tufford, am acting as Education administrator for [employer]. Mike Gip, MD, PhD.  I, Monserratt Knezevic C. Mike Gip, MD, have reviewed the above  documentation for accuracy and completeness, and I agree with the above.

## 2019-10-29 ENCOUNTER — Inpatient Hospital Stay: Payer: Medicare HMO

## 2019-10-29 ENCOUNTER — Other Ambulatory Visit: Payer: Self-pay

## 2019-10-29 ENCOUNTER — Inpatient Hospital Stay: Payer: Medicare HMO | Attending: Hematology and Oncology | Admitting: Hematology and Oncology

## 2019-10-29 VITALS — BP 119/82 | HR 95 | Temp 98.2°F | Resp 18 | Wt 172.0 lb

## 2019-10-29 DIAGNOSIS — K629 Disease of anus and rectum, unspecified: Secondary | ICD-10-CM | POA: Diagnosis not present

## 2019-10-29 DIAGNOSIS — C73 Malignant neoplasm of thyroid gland: Secondary | ICD-10-CM | POA: Insufficient documentation

## 2019-10-29 DIAGNOSIS — R490 Dysphonia: Secondary | ICD-10-CM

## 2019-10-29 DIAGNOSIS — R21 Rash and other nonspecific skin eruption: Secondary | ICD-10-CM | POA: Insufficient documentation

## 2019-10-29 DIAGNOSIS — Z9221 Personal history of antineoplastic chemotherapy: Secondary | ICD-10-CM

## 2019-10-31 ENCOUNTER — Other Ambulatory Visit: Payer: Self-pay

## 2019-10-31 DIAGNOSIS — C73 Malignant neoplasm of thyroid gland: Secondary | ICD-10-CM

## 2019-11-05 ENCOUNTER — Telehealth: Payer: Self-pay | Admitting: Hematology and Oncology

## 2019-11-05 NOTE — Telephone Encounter (Signed)
I have left several message today and lastThursday about his appointments. I asked that he call me back for confirmation.

## 2019-11-06 ENCOUNTER — Telehealth: Payer: Self-pay | Admitting: Hematology and Oncology

## 2019-11-06 ENCOUNTER — Ambulatory Visit: Admission: RE | Admit: 2019-11-06 | Payer: Medicare HMO | Source: Ambulatory Visit

## 2019-11-06 ENCOUNTER — Inpatient Hospital Stay: Payer: Medicare HMO | Attending: Hematology and Oncology

## 2019-11-06 NOTE — Telephone Encounter (Signed)
I called Grant Freeman because he did not come for his labs or CT neck scan. He has a PET Scan scheduled for tomorrow and I left a message reminding him of that appt.

## 2019-11-07 ENCOUNTER — Ambulatory Visit: Payer: Medicare HMO

## 2019-11-11 ENCOUNTER — Ambulatory Visit: Payer: Medicare HMO | Admitting: Hematology and Oncology

## 2019-11-21 ENCOUNTER — Other Ambulatory Visit: Payer: Self-pay

## 2019-11-21 ENCOUNTER — Ambulatory Visit
Admission: RE | Admit: 2019-11-21 | Discharge: 2019-11-21 | Disposition: A | Discharge status: Home / Self Care | Payer: Medicare HMO | Source: Ambulatory Visit | Attending: Hematology and Oncology | Admitting: Hematology and Oncology

## 2019-11-21 DIAGNOSIS — R911 Solitary pulmonary nodule: Secondary | ICD-10-CM | POA: Diagnosis not present

## 2019-11-21 DIAGNOSIS — C73 Malignant neoplasm of thyroid gland: Secondary | ICD-10-CM | POA: Diagnosis present

## 2019-11-21 DIAGNOSIS — I700 Atherosclerosis of aorta: Secondary | ICD-10-CM | POA: Insufficient documentation

## 2019-11-21 DIAGNOSIS — K760 Fatty (change of) liver, not elsewhere classified: Secondary | ICD-10-CM | POA: Diagnosis not present

## 2019-11-21 LAB — GLUCOSE, CAPILLARY: Glucose-Capillary: 113 mg/dL — ABNORMAL HIGH (ref 70–99)

## 2019-11-21 MED ORDER — FLUDEOXYGLUCOSE F - 18 (FDG) INJECTION
8.9000 | Freq: Once | INTRAVENOUS | Status: AC | PRN
  Administered 2019-11-21: 9.78 via INTRAVENOUS

## 2019-11-22 ENCOUNTER — Ambulatory Visit
Admission: RE | Admit: 2019-11-22 | Discharge: 2019-11-22 | Disposition: A | Discharge status: Home / Self Care | Payer: Medicare HMO | Source: Ambulatory Visit | Attending: Hematology and Oncology | Admitting: Hematology and Oncology

## 2019-11-22 ENCOUNTER — Other Ambulatory Visit: Payer: Self-pay

## 2019-11-22 ENCOUNTER — Other Ambulatory Visit: Payer: Self-pay | Admitting: Hematology and Oncology

## 2019-11-22 DIAGNOSIS — C73 Malignant neoplasm of thyroid gland: Secondary | ICD-10-CM

## 2019-11-22 HISTORY — DX: Malignant neoplasm of thyroid gland: C73

## 2019-11-22 LAB — POCT I-STAT CREATININE: Creatinine, Ser: 0.8 mg/dL (ref 0.61–1.24)

## 2019-11-22 MED ORDER — IOHEXOL 300 MG/ML  SOLN
75.0000 mL | Freq: Once | INTRAMUSCULAR | Status: AC | PRN
  Administered 2019-11-22: 75 mL via INTRAVENOUS

## 2019-11-23 NOTE — Progress Notes (Addendum)
[facility]  [address] [place], [place] [address] Phone: [phone]  Fax: [phone]   Clinic Day:  11/25/2019  Referring physician: Leonel Ramsay, MD  Chief Complaint: Grant Freeman is a 85 y.o. male with medullary thyroid cancer who is seen for review of interval PET scan and discussion regarding direction of therapy.    HPI: The patient was last seen in the medical oncology clinic on 10/29/2019. At that time, he noted loss of pigmentation and scarring on his neck and arms that he felt was related to medullary thyroid carcinoma. He was currently taking maple syrup and baking powder. Exam revealed significant neck scarring and hypopigmentation felt secondary to prior treatment (neck dissection) and possibly topical blood root.  Blood root had been applied to areas of skin involved.  PET-scan on 11/21/2019 noted diffuse low level hypermetabolism seen in the left thyroid cartilage region including asymmetric soft tissue just superficial to the cartilage. This may reflect the FDG avid "cricopharyngeal soft tissue" described in the outside report from 2018. Metastatic disease remained a concern. There was a 1.9 cm right retrotracheal soft tissue nodule in the region of the thoracic inlet shows low level hypermetabolism, concerning for metastatic disease. There was a 9 mm anterior right upper lobe pulmonary nodule increased from 4 mm on the study 9 years ago.  There was no hypermetabolism. Relatively slow growth would suggest a benign etiology, but follow-up may be warranted to exclude low-grade, poorly FDG avid neoplasm. There was a 5 mm right lower lobe pulmonary nodule new since 2012. Continue attention on follow-up recommended. There was hepatic steatosis.   Soft tissue neck CT on 11/22/2019 revealed a 2 cm mass at the right tracheoesophageal groove  new from PET scan in 2012, presumed local recurrence. A 7 x 17 mm nodule anterior to the upper  cricoid cartilage was also worrisome, but not hypermetabolic. There was a 11 mm lymph node at the junction of the right supraclavicular fossa and IJ chain, larger than in 2012 and suspicious.  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 There was a right upper lobe pulmonary nodule, reference preceding PET-CT.  During the interim, he stated "I could be doing better". He reports breakouts on his hands. His hands are dry and cracked. He reports no changes since last visit. His voice is still hoarse. He applies maple syrup and baking powder on right side of neck.  He denies using blood root for years. He thinks scarring is worse secondary to carcinoma.    Past Medical History:  Diagnosis Date  . Thyroid cancer, medullary carcinoma [facility])     Past Surgical History:  Procedure Laterality Date  . THYROIDECTOMY      No family history on file.  Social History:  reports that he has never smoked. He has quit using smokeless tobacco. He reports current alcohol use of about 2.0 standard drinks of alcohol per week. He reports that he does not use drugs. The patient drinks beer or wine a couple times per weeks. Denies tobacco or drug use. He still lives in [place]. He is a retired Clinical biochemist. The patient is alone today.  Allergies:  Allergies  Allergen Reactions  . Penicillins Hives    Current Medications: Current Outpatient Medications  Medication Sig Dispense Refill  . acetaminophen (TYLENOL) 500 MG tablet Take by mouth. (Patient not taking: Reported on 10/29/2019)    . HYDROcodone-acetaminophen (NORCO/VICODIN) 5-325 MG tablet Take 1 tablet by mouth every 8 (eight) hours  as needed for moderate pain. (Patient not taking: Reported on 10/29/2019) 30 tablet 0  . hydrOXYzine (VISTARIL) 25 MG capsule  (Patient not taking: Reported on 10/29/2019)    . levothyroxine (SYNTHROID, LEVOTHROID) 112 MCG tablet Take 1 tablet by mouth daily.    Marland Kitchen MELATONIN PO Take by mouth.    . saw palmetto 80 MG capsule  Take 80 mg by mouth 2 (two) times daily. (Patient not taking: Reported on 10/29/2019)    . sulfamethoxazole-trimethoprim (BACTRIM DS) 800-160 MG tablet  (Patient not taking: Reported on 10/29/2019)    . vandetanib (CAPRELSA) 300 MG tablet Take 1 tablet (300 mg total) by mouth daily. (Patient not taking: Reported on 10/29/2019) 30 tablet 0   No current facility-administered medications for this visit.    Review of Systems  Constitutional: Negative for chills, diaphoresis, fever, malaise/fatigue and weight loss (stable).       "I could be doing better".  HENT: Negative for congestion, ear discharge, ear pain, hearing loss, nosebleeds, sinus pain, sore throat and tinnitus.        Hoarse voice x 3-4 months  Eyes: Negative for blurred vision.  Respiratory: Negative for cough, hemoptysis, sputum production and shortness of breath.   Cardiovascular: Negative for chest pain, palpitations and leg swelling.  Gastrointestinal: Negative for abdominal pain, blood in stool, constipation, diarrhea, heartburn, melena, nausea and vomiting.  Genitourinary: Negative for dysuria, frequency, hematuria and urgency.  Musculoskeletal: Negative for back pain, joint pain, myalgias and neck pain.  Skin: Negative for itching and rash.       Losing pigmentation on the right side of his face and neck. Outbreak on hands. Dry cracked hands. Applying maple syrup and baking powder on right side of neck.  Neurological: Negative for dizziness, tingling, sensory change, weakness and headaches.  Endo/Heme/Allergies: Does not bruise/bleed easily.  Psychiatric/Behavioral: Negative for depression and memory loss. The patient is not nervous/anxious and does not have insomnia.   All other systems reviewed and are negative.  Performance status (ECOG): 1  Vitals Blood pressure 132/80, pulse 87, temperature 98.7 F (37.1 C), temperature source Tympanic, weight 171 lb 13.6 oz (78 kg), SpO2 97 %.   Physical Exam Vitals and nursing  note reviewed.  Constitutional:      General: He is not in acute distress.    Appearance: He is not ill-appearing or diaphoretic.  HENT:     Head: Normocephalic and atraumatic.     Comments: Wearing a bucket cap. Eyes:     General: No scleral icterus.    Extraocular Movements: Extraocular movements intact.     Conjunctiva/sclera: Conjunctivae normal.  Neck:     Comments: Significant scarring of neck. Skin:    Comments: Scarring on arms and neck (right > left).  Neurological:     Mental Status: He is alert and oriented to person, place, and time.  Psychiatric:        Behavior: Behavior normal.        Thought Content: Thought content normal.        Judgment: Judgment normal.    No visits with results within 3 Day(s) from this visit.  Latest known visit with results is:  Hospital Outpatient Visit on 11/22/2019  Component Date Value Ref Range Status  . Creatinine, Ser 11/22/2019 0.80  0.61 - 1.24 mg/dL Final    Assessment:  ELJAY LAVE is a 85 y.o. male with stage IV medullary thyroid cancer s/p medullary thyroid cancer s/p bilateral thyroidectomy 07/06/2000.  Pathology revealed a  5.2 cm medullary carcinoma. Cancer extended up to but not through the thyroid capsule. 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 Right sentinel lymph node was negative. Left central neck lymph node was positive with a small focus of metastatic medullary carcinoma. Two lymph nodes in the left upper mediastinum were negative for cancer and a third paratracheal lymph node was negative.  He received an unknown number cycles of carboplatin and Taxol beginning in 11/2001.  He developed recurrent disease in 2006. He underwent right neck dissection on 10/04/2004.  There was a 4.2 cm soft tissue mass of recurrent medullary thyroid cancer. Four of 4 right-sided neck nodes were positive.  The largest node was 4.2 cm with extracapsular extension into the soft tissue.  A right neck  level IIB dissection revealed 8 of 11 lymph nodes positive for metastatic thyroid cancer. The largest focus was 5 mm with tumor present in perinodal lymphatics.  He received DTIC (dacarbazine) with some improvement in serum markers in 2010.  PET scan on 03/11/2010 revealed increased FDG uptake in the left base of neck evidence of distant metastasis.  Notes indicate additional neck dissection in 2012 for recurrent disease.  He received vandetanib from 05/2009 through 05/17/2010. Treatment was interrupted in 12/2009 secondary to an infection in his arm. Dose was reduced from 300 mg a day to 02/2010 at 200 mg a day in 02/2010. He believes his tumor marker dramatically improved with therapy.  He received no TKI therapy between 2012 in 2014 secondary to patient refusal. Calcitonin remained in the 875 range in 03/2012.  In 03/2012, he began ingesting and applying "blood root" to his neck.  PET scan on 10/18/2012 revealed an increase in a 3.19mm nodule to 68mm, but no visible disease in neck, mediastinum or abdomen.  Recommendation was for no vandetanib.  PET scan on 04/01/2016 was suboptimal secondary to altered biomechanics after recent food ingestion prior to the scan. There was new retrotracheal, paraesophageal soft tissue nodule without increased metabolic activity (may falsely lower SUV secondary to altered biomechanics).There was unchanged subcentimeter pulmonary nodule in the right upper lobe which was below the size threshold for PET imaging.  Neck soft tissue CT scan on 04/01/2016 revealed new 1.2 x 0.9 cm retrotracheal, paraesophageal enhancing soft tissue nodule suspicious for metastatic disease, but does not appear to be associated with definite increased metabolic activity.  There was redemonstration of a 1.6 x 0.9 cm enhancing soft tissue nodules in the right thyroid bed and adjacent to the cricothyroid cartilage.  There was no significant interval change in size of right 6 mm apical pulmonary  nodule.  [facility] on 09/30/2016 revealed redemonstrated minimally FDG avid retrotracheal, paraesophageal soft tissue with limited comparison to prior study given previous muscle uptake. This remained concerning for possible metastatic disease. There was unchanged CT appearance of enhancing cricothyroid soft tissue also with mild FDG avidity, likely ectopic thyroid tissue or level VI lymph nodes;  metastatic disease was not excluded. There was no evidence of new FDG avid disease.  Diagnostic neck CT on 09/30/2016 revealed stable appearance of right retrotracheal, paraesophageal enhancing soft tissue nodule (1.3 x 0.9 cm compared to 1.2 x 0.9 cm). This lesion was suspicious for metastatic disease. Attention on follow-up.  There was redemonstration of extensive enhancing soft tissue nodules in the right thyroid bed and adjacent the cricothyroid cartilage which were unchanged in size and may represent residual or regenerated thyroid tissue.  There was opacification of the maxillary, ethmoid and  frontal sinuses which may be seen in the setting of sinusitis.  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   PET scan on 11/21/2019 noted diffuse low level hypermetabolism seen in the left thyroid cartilage region including asymmetric soft tissue just superficial to the cartilage. This may reflect the FDG avid "cricopharyngeal soft tissue" described in the outside report from 2018. Metastatic disease remained a concern. There was a 1.9 cm right retrotracheal soft tissue nodule in the region of the thoracic inlet shows low level hypermetabolism, concerning for metastatic disease. There was a 9 mm anterior right upper lobe pulmonary nodule increased from 4 mm on the study 9 years ago.  There was no hypermetabolism. Relatively slow growth would suggest a benign etiology, but follow-up may be warranted to exclude low-grade, poorly FDG avid neoplasm. There was a 5 mm right lower lobe pulmonary nodule new since 2012.  Continue attention on follow-up recommended. There was hepatic steatosis.  Soft tissue neck CT on 11/22/2019 revealed a 2 cm mass at the right tracheoesophageal groove  new from PET scan in 2012, presumed local recurrence. A 7 x 17 mm nodule anterior to the upper cricoid cartilage was also worrisome, but not hypermetabolic. There was a 11 mm lymph node at the junction of the right supraclavicular fossa and IJ chain, larger than in 2012 and suspicious.  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 There was a right upper lobe pulmonary nodule, reference preceding PET-CT.  Calcitonin has been followed:  2,721 on 05/24/2010, 706 on 08/11/2010, 714 on 10/06/2010, 768 on 11/26/2010, 1,013 on 07/14/2011, 893 on 04/03/2012, 956 on 06/04/2012, 653 on 10/01/2012, 967 on 10/18/2012, 601 on 12/03/2012, 753 and 11/05/2013, 1,121 on 02/06/2014, 833 on 05/06/2014, 1321 on 08/19/2014, 1171 on 03/10/2016, 1411 on 12/22/2015, 1,254 on 04/06/2016, 1260 on 09/13/2016, 2000 on 04/20/2018, 4831 on 10/16/2019, and 4975.0 on 11/25/2019.  CEA has been followed:  43 and 09/16/2009, 40.8 on 10/05/2009, 38.5 on 11/09/2009, 46.6 on 12/16/2009, 36.9 on 01/27/2010, 40.1 on 02/23/2010, 42.1 on 03/25/2010, 47.9 on 04/20/2010, 44.6 on 05/24/2010, 22.2 on 08/11/2010, 15.2 on 10/06/2010, 18.4 on 11/26/2010, 19.4 on 07/14/2011, 23 on 04/03/2012, 22.6 on 10/01/2012, 35.4 on 12/22/2015, 43.9 on 04/06/2016, 43.5 on 09/13/2016, 69.4 on 04/20/2018, 73.7 on 10/16/2019, and 77.1 on 11/25/2019.  The patient does not plan to receive the COVID-19 vaccine.  Symptomatically, he states "I could be doing better". He reports breakouts on his hands. His hands are dry and cracked. His voice is still hoarse. He uses maple syrup and baking powder.  He denies using blood root for years.   Plan: 1.   Labs today:  CBC with diff, CMP, calcitonin, CEA. 2.   Medullary thyroid carcinoma  Clinically, his exam is similar to 2018.  His clinical course has been  indolent.   Kinase inhibitor therapy may not be appropriate for slowly progressive/indolent disease.   Previously, he received vandetinib; vandetinib has a RR of about 37% in clinical trials.   There are side effects with therapy which can affect quality of life.    Side effects include increased QT, dermatologic toxicity, diarrhea, CHF, hypertension, hemorrhage,CVA, ILD, hypocalcemia.  Calcitonin and CEA have increased over time.  Discuss plan for comparison of PET scan at [facility] to current scan.   For asymptomatic patients with tumor < 1-2 cm tumor growing < 20 %/year, guidelines suggest monitoring.   For metastasis at least 1-2 cm, growing > 20 %/year or symptomatic, consider treatment.  Patient previously not considered a surgical  candidate.  Present at tumor board. 3.   Tumor board on 11/28/2019. 4.   MD to call patient after tumor board. 5.   RTC based on above.  I discussed the assessment and treatment plan with the patient.  The patient was provided an opportunity to ask questions and all were answered.  The patient agreed with the plan and demonstrated an understanding of the instructions.  The patient was advised to call back if the symptoms worsen or if the condition fails to improve as anticipated.   Zyaire Mccleod C. Mike Gip, MD, PhD    11/25/2019, 3:58 PM  I, Selena Batten, am acting as scribe for [employer]. Mike Gip, MD, PhD.  I, Lamontae Ricardo C. Mike Gip, MD, have reviewed the above documentation for accuracy and completeness, and I agree with the above. Marland Kitchen

## 2019-11-25 ENCOUNTER — Other Ambulatory Visit: Payer: Self-pay

## 2019-11-25 ENCOUNTER — Inpatient Hospital Stay: Payer: Medicare HMO

## 2019-11-25 ENCOUNTER — Encounter: Payer: Self-pay | Admitting: Hematology and Oncology

## 2019-11-25 ENCOUNTER — Inpatient Hospital Stay: Payer: Medicare HMO | Attending: Hematology and Oncology | Admitting: Hematology and Oncology

## 2019-11-25 VITALS — BP 132/80 | HR 87 | Temp 98.7°F | Wt 171.8 lb

## 2019-11-25 DIAGNOSIS — Z79899 Other long term (current) drug therapy: Secondary | ICD-10-CM | POA: Insufficient documentation

## 2019-11-25 DIAGNOSIS — R978 Other abnormal tumor markers: Secondary | ICD-10-CM | POA: Diagnosis not present

## 2019-11-25 DIAGNOSIS — C73 Malignant neoplasm of thyroid gland: Secondary | ICD-10-CM | POA: Diagnosis not present

## 2019-11-25 LAB — CBC WITH DIFFERENTIAL/PLATELET
Abs Immature Granulocytes: 0.01 10*3/uL (ref 0.00–0.07)
Basophils Absolute: 0.1 10*3/uL (ref 0.0–0.1)
Basophils Relative: 1 %
Eosinophils Absolute: 0.3 10*3/uL (ref 0.0–0.5)
Eosinophils Relative: 5 %
HCT: 47.1 % (ref 39.0–52.0)
Hemoglobin: 16 g/dL (ref 13.0–17.0)
Immature Granulocytes: 0 %
Lymphocytes Relative: 21 %
Lymphs Abs: 1.2 10*3/uL (ref 0.7–4.0)
MCH: 31.3 pg (ref 26.0–34.0)
MCHC: 34 g/dL (ref 30.0–36.0)
MCV: 92 fL (ref 80.0–100.0)
Monocytes Absolute: 0.6 10*3/uL (ref 0.1–1.0)
Monocytes Relative: 11 %
Neutro Abs: 3.6 10*3/uL (ref 1.7–7.7)
Neutrophils Relative %: 62 %
Platelets: 290 10*3/uL (ref 150–400)
RBC: 5.12 MIL/uL (ref 4.22–5.81)
RDW: 14.2 % (ref 11.5–15.5)
WBC: 5.8 10*3/uL (ref 4.0–10.5)
nRBC: 0 % (ref 0.0–0.2)

## 2019-11-25 LAB — COMPREHENSIVE METABOLIC PANEL
ALT: 45 U/L — ABNORMAL HIGH (ref 0–44)
AST: 33 U/L (ref 15–41)
Albumin: 4.6 g/dL (ref 3.5–5.0)
Alkaline Phosphatase: 55 U/L (ref 38–126)
Anion gap: 9 (ref 5–15)
BUN: 13 mg/dL (ref 8–23)
CO2: 24 mmol/L (ref 22–32)
Calcium: 9.2 mg/dL (ref 8.9–10.3)
Chloride: 103 mmol/L (ref 98–111)
Creatinine, Ser: 0.83 mg/dL (ref 0.61–1.24)
GFR calc Af Amer: >60 mL/min (ref >60)
GFR calc non Af Amer: >60 mL/min (ref >60)
Glucose, Bld: 107 mg/dL — ABNORMAL HIGH (ref 70–99)
Potassium: 4.5 mmol/L (ref 3.5–5.1)
Sodium: 136 mmol/L (ref 135–145)
Total Bilirubin: 1.1 mg/dL (ref 0.3–1.2)
Total Protein: 8.1 g/dL (ref 6.5–8.1)

## 2019-11-25 NOTE — Patient Instructions (Signed)
  Bag balm heavy cream for your hands.

## 2019-11-25 NOTE — Progress Notes (Signed)
No new changes noted today 

## 2019-11-26 LAB — CEA: CEA: 77.1 ng/mL — ABNORMAL HIGH (ref 0.0–4.7)

## 2019-11-29 LAB — CALCITONIN: Calcitonin: 4975 pg/mL — ABNORMAL HIGH (ref 0.0–8.4)

## 2019-12-01 NOTE — Progress Notes (Signed)
[facility]  [address], [address] [place], [place] [address] Phone: [phone]  Fax: [phone]   Clinic Day:  12/02/2019  Referring physician: Leonel Ramsay, MD  Chief Complaint: Grant Freeman is a 85 y.o. male with medullary thyroid cancer who is seen for 1 week assessment and discussion regarding direction of therapy.  HPI: The patient was last seen in the medical oncology clinic on 11/25/2019. At that time, he reported breakouts on his hands. His hands are dry and cracked. His voice was hoarse. He continued sel medicate with maple syrup and baking powder.  He denied using blood root for years.   Hematocrit was 47.1, hemoglobin 16.0, platelets 290,000, WBC 5,800. ALT was 45. Calcitonin was 4,975.0. CEA was 77.1.  He was presented at tumor board on 11/28/2019.  We discussed biopsy and genetic testing.  During the interim, he has been "ok". He states that his hands are much better. To treat his cancer at home, he mixes together maple syrup and baking powder over the stove and eats 2-3 oz with a spoon. He does this twice weekly. He states that he has never had radiation.   We discussed the benefits of [facility] genetic testing. The patient agrees to genetic testing. We discussed an ENT referral again for evaluation of his ongoing hoarseness.  He believes that the surgeries he had damaged his vocal cords and declined an ENT referral. The patient agrees to a needle biopsy.  He also agrees to an EKG pre-treatment.  The patient is interested in vandetanib because he believes that this "almost knocked the cancer out of him" when he took it before. We discussed that given the side effects of vandetanib; other treatments options were reviewed. The patient states that "he knows his health issues" and that "the vandetanib will take the cancer out."   Past Medical History:  Diagnosis Date  . Thyroid cancer, medullary carcinoma [facility])     Past Surgical History:   Procedure Laterality Date  . THYROIDECTOMY      History reviewed. No pertinent family history.  Social History:  reports that he has never smoked. He has quit using smokeless tobacco. He reports current alcohol use of about 2.0 standard drinks of alcohol per week. He reports that he does not use drugs. The patient drinks beer or wine a couple times per weeks. Denies tobacco or drug use. He still lives in [place]. He is a retired Clinical biochemist. The patient is alone today.  Allergies:  Allergies  Allergen Reactions  . Penicillins Hives    Current Medications: Current Outpatient Medications  Medication Sig Dispense Refill  . hydrOXYzine (VISTARIL) 25 MG capsule Take 25 mg by mouth every 6 (six) hours as needed.     Marland Kitchen levothyroxine (SYNTHROID, LEVOTHROID) 112 MCG tablet Take 1 tablet by mouth daily.    Marland Kitchen MELATONIN PO Take 1 Dose by mouth at bedtime as needed.     Marland Kitchen acetaminophen (TYLENOL) 500 MG tablet Take by mouth. (Patient not taking: Reported on 10/29/2019)    . saw palmetto 80 MG capsule Take 80 mg by mouth 2 (two) times daily. (Patient not taking: Reported on 10/29/2019)    . vandetanib (CAPRELSA) 300 MG tablet Take 1 tablet (300 mg total) by mouth daily. (Patient not taking: Reported on 10/29/2019) 30 tablet 0   No current facility-administered medications for this visit.  Review of Systems  Constitutional: Negative for chills, diaphoresis, fever, malaise/fatigue and weight loss (up 1 lb).  HENT: Negative  for congestion, ear discharge, ear pain, hearing loss, nosebleeds, sinus pain, sore throat and tinnitus.        Hoarse voice.  Eyes: Negative for blurred vision.  Respiratory: Negative for cough, hemoptysis, sputum production and shortness of breath.   Cardiovascular: Negative for chest pain, palpitations and leg swelling.  Gastrointestinal: Negative for abdominal pain, blood in stool, constipation, diarrhea, heartburn, melena, nausea and vomiting.       Consuming baking powder  and maple syrup twice weekly.  Genitourinary: Negative for dysuria, frequency, hematuria and urgency.  Musculoskeletal: Negative for back pain, joint pain, myalgias and neck pain.  Skin: Negative for itching and rash.  Neurological: Negative for dizziness, tingling, sensory change, weakness and headaches.  Endo/Heme/Allergies: Does not bruise/bleed easily.  Psychiatric/Behavioral: Negative for depression and memory loss. The patient is not nervous/anxious and does not have insomnia.   All other systems reviewed and are negative.  Performance status (ECOG): 1  Vitals Blood pressure 118/69, pulse 89, temperature 98.6 F (37 C), temperature source Tympanic, weight 172 lb 6.4 oz (78.2 kg), SpO2 97 %.   Physical Exam Vitals and nursing note reviewed.  Constitutional:      General: He is not in acute distress.    Appearance: He is not diaphoretic.  Eyes:     General: No scleral icterus.    Conjunctiva/sclera: Conjunctivae normal.  Neurological:     Mental Status: He is alert and oriented to person, place, and time.  Psychiatric:        Behavior: Behavior normal.        Thought Content: Thought content normal.        Judgment: Judgment normal.    No visits with results within 3 Day(s) from this visit.  Latest known visit with results is:  Office Visit on 11/25/2019  Component Date Value Ref Range Status  . Calcitonin 11/25/2019 4,975.0* 0.0 - 8.4 pg/mL Final   Comment: (NOTE) **Results verified by repeat testing** Siemens Immulite 2000 Immunochemiluminometric assay (ICMA) Values obtained with different assay methods or kits cannot be used interchangeably. Results cannot be interpreted as absolute evidence of the presence or absence of malignant disease. Performed At: [facility] [place], [place] [id number] Rush Farmer MD [id number]   . CEA 11/25/2019 77.1* 0.0 - 4.7 ng/mL Final   Comment: (NOTE)                             Nonsmokers           <3.9                             Smokers             <5.6 Roche [employer] Electrochemiluminescence Immunoassay (ECLIA) Values obtained with different assay methods or kits cannot be used interchangeably.  Results cannot be interpreted as absolute evidence of the presence or absence of malignant disease. Performed At: [facility] [place], [place] [id number] Rush Farmer MD [id number]   . Sodium 11/25/2019 136  135 - 145 mmol/L Final  . Potassium 11/25/2019 4.5  3.5 - 5.1 mmol/L Final  . Chloride 11/25/2019 103  98 - 111 mmol/L Final  . CO2 11/25/2019 24  22 - 32 mmol/L Final  . Glucose, Bld 11/25/2019 107* 70 - 99 mg/dL Final   Glucose reference range applies only to samples taken after fasting for at least  8 hours.  . BUN 11/25/2019 13  8 - 23 mg/dL Final  . Creatinine, Ser 11/25/2019 0.83  0.61 - 1.24 mg/dL Final  . Calcium 11/25/2019 9.2  8.9 - 10.3 mg/dL Final  . Total Protein 11/25/2019 8.1  6.5 - 8.1 g/dL Final  . Albumin 11/25/2019 4.6  3.5 - 5.0 g/dL Final  . AST 11/25/2019 33  15 - 41 U/L Final  . ALT 11/25/2019 45* 0 - 44 U/L Final  . Alkaline Phosphatase 11/25/2019 55  38 - 126 U/L Final  . Total Bilirubin 11/25/2019 1.1  0.3 - 1.2 mg/dL Final  . GFR calc non Af Amer 11/25/2019 >60  >60 mL/min Final  . GFR calc Af Amer 11/25/2019 >60  >60 mL/min Final  . Anion gap 11/25/2019 9  5 - 15 Final   Performed at [facility], [address], [place], [place] [address]  . WBC 11/25/2019 5.8  4.0 - 10.5 K/uL Final  . RBC 11/25/2019 5.12  4.22 - 5.81 MIL/uL Final  . Hemoglobin 11/25/2019 16.0  13.0 - 17.0 g/dL Final  . HCT 11/25/2019 47.1  39 - 52 % Final  . MCV 11/25/2019 92.0  80.0 - 100.0 fL Final  . MCH 11/25/2019 31.3  26.0 - 34.0 pg Final  . MCHC 11/25/2019 34.0  30.0 - 36.0 g/dL Final  . RDW 11/25/2019 14.2  11.5 - 15.5 % Final  . Platelets 11/25/2019 290  150 - 400 K/uL Final  . nRBC 11/25/2019 0.0  0.0 - 0.2 % Final  .  Neutrophils Relative % 11/25/2019 62  % Final  . Neutro Abs 11/25/2019 3.6  1.7 - 7.7 K/uL Final  . Lymphocytes Relative 11/25/2019 21  % Final  . Lymphs Abs 11/25/2019 1.2  0.7 - 4.0 K/uL Final  . Monocytes Relative 11/25/2019 11  % Final  . Monocytes Absolute 11/25/2019 0.6  0 - 1 K/uL Final  . Eosinophils Relative 11/25/2019 5  % Final  . Eosinophils Absolute 11/25/2019 0.3  0 - 0 K/uL Final  . Basophils Relative 11/25/2019 1  % Final  . Basophils Absolute 11/25/2019 0.1  0 - 0 K/uL Final  . Immature Granulocytes 11/25/2019 0  % Final  . Abs Immature Granulocytes 11/25/2019 0.01  0.00 - 0.07 K/uL Final   Performed at [facility], [address] [place], [place] [address]    Assessment:  KORAY SOTER is a 85 y.o. male with stage IV medullary thyroid cancer s/p medullary thyroid cancer s/p bilateral thyroidectomy 07/06/2000.  Pathology revealed a 5.2 cm medullary carcinoma. Cancer extended up to but not through the thyroid capsule. 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 Right sentinel lymph node was negative. Left central neck lymph node was positive with a small focus of metastatic medullary carcinoma. Two lymph nodes in the left upper mediastinum were negative for cancer and a third paratracheal lymph node was negative.  He received an unknown number cycles of carboplatin and Taxol beginning in 11/2001.  He developed recurrent disease in 2006. He underwent right neck dissection on 10/04/2004.  There was a 4.2 cm soft tissue mass of recurrent medullary thyroid cancer. Four of 4 right-sided neck nodes were positive.  The largest node was 4.2 cm with extracapsular extension into the soft tissue.  A right neck level IIB dissection revealed 8 of 11 lymph nodes positive for metastatic thyroid cancer. The largest focus was 5 mm with tumor present in perinodal lymphatics.  He received DTIC (dacarbazine) with some improvement in serum markers in  2010.  PET scan on 03/11/2010 revealed increased FDG uptake in the left base of neck evidence of distant metastasis.  Notes indicate additional neck dissection in 2012 for recurrent disease.  He received vandetanib from 05/2009 through 05/17/2010. Treatment was interrupted in 12/2009 secondary to an infection in his arm. Dose was reduced from 300 mg a day to 02/2010 at 200 mg a day in 02/2010. He believes his tumor marker dramatically improved with therapy.  He received no TKI therapy between 2012 in 2014 secondary to patient refusal. Calcitonin remained in the 875 range in 03/2012.  In 03/2012, he began ingesting and applying "blood root" to his neck.  PET scan on 10/18/2012 revealed an increase in a 3.84mm nodule to 26mm, but no visible disease in neck, mediastinum or abdomen.  Recommendation was for no vandetanib.  PET scan on 04/01/2016 was suboptimal secondary to altered biomechanics after recent food ingestion prior to the scan. There was new retrotracheal, paraesophageal soft tissue nodule without increased metabolic activity (may falsely lower SUV secondary to altered biomechanics).There was unchanged subcentimeter pulmonary nodule in the right upper lobe which was below the size threshold for PET imaging.  Neck soft tissue CT scan on 04/01/2016 revealed new 1.2 x 0.9 cm retrotracheal, paraesophageal enhancing soft tissue nodule suspicious for metastatic disease, but does not appear to be associated with definite increased metabolic activity.  There was redemonstration of a 1.6 x 0.9 cm enhancing soft tissue nodules in the right thyroid bed and adjacent to the cricothyroid cartilage.  There was no significant interval change in size of right 6 mm apical pulmonary nodule.  PET scan at [facility] on 09/30/2016 revealed redemonstrated minimally FDG avid retrotracheal, paraesophageal soft tissue with limited comparison to prior study given previous muscle uptake. This remained concerning for possible  metastatic disease. There was unchanged CT appearance of enhancing cricothyroid soft tissue also with mild FDG avidity, likely ectopic thyroid tissue or level VI lymph nodes;  metastatic disease was not excluded. There was no evidence of new FDG avid disease.  Diagnostic neck CT on 09/30/2016 revealed stable appearance of right retrotracheal, paraesophageal enhancing soft tissue nodule (1.3 x 0.9 cm compared to 1.2 x 0.9 cm). This lesion was suspicious for metastatic disease. Attention on follow-up.  There was redemonstration of extensive enhancing soft tissue nodules in the right thyroid bed and adjacent the cricothyroid cartilage which were unchanged in size and may represent residual or regenerated thyroid tissue.  There was opacification of the maxillary, ethmoid and frontal sinuses which may be seen in the setting of sinusitis.  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   PET scan on 11/21/2019 noted diffuse low level hypermetabolism seen in the left thyroid cartilage region including asymmetric soft tissue just superficial to the cartilage. This may reflect the FDG avid "cricopharyngeal soft tissue" described in the outside report from 2018. Metastatic disease remained a concern. There was a 1.9 cm right retrotracheal soft tissue nodule in the region of the thoracic inlet shows low level hypermetabolism, concerning for metastatic disease. There was a 9 mm anterior right upper lobe pulmonary nodule increased from 4 mm on the study 9 years ago.  There was no hypermetabolism. Relatively slow growth would suggest a benign etiology, but follow-up may be warranted to exclude low-grade, poorly FDG avid neoplasm. There was a 5 mm right lower lobe pulmonary nodule new since 2012. Continue attention on follow-up recommended. There was hepatic  steatosis.  Soft tissue neck CT on 11/22/2019 revealed a 2 cm mass at the right tracheoesophageal groove  new from PET scan in 2012, presumed local recurrence. A 7 x 17  mm nodule anterior to the upper cricoid cartilage was also worrisome, but not hypermetabolic. There was a 11 mm lymph node at the junction of the right supraclavicular fossa and IJ chain, larger than in 2012 and suspicious.  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 There was a right upper lobe pulmonary nodule, reference preceding PET-CT.  Calcitonin has been followed:  2,721 on 05/24/2010, 706 on 08/11/2010, 714 on 10/06/2010, 768 on 11/26/2010, 1,013 on 07/14/2011, 893 on 04/03/2012, 956 on 06/04/2012, 653 on 10/01/2012, 967 on 10/18/2012, 601 on 12/03/2012, 753 and 11/05/2013, 1,121 on 02/06/2014, 833 on 05/06/2014, 1321 on 08/19/2014, 1171 on 03/10/2016, 1411 on 12/22/2015, 1,254 on 04/06/2016, 1260 on 09/13/2016, 2000 on 04/20/2018, 4831 on 10/16/2019, and 4975.0 on 11/25/2019.  CEA has been followed:  43 and 09/16/2009, 40.8 on 10/05/2009, 38.5 on 11/09/2009, 46.6 on 12/16/2009, 36.9 on 01/27/2010, 40.1 on 02/23/2010, 42.1 on 03/25/2010, 47.9 on 04/20/2010, 44.6 on 05/24/2010, 22.2 on 08/11/2010, 15.2 on 10/06/2010, 18.4 on 11/26/2010, 19.4 on 07/14/2011, 23 on 04/03/2012, 22.6 on 10/01/2012, 35.4 on 12/22/2015, 43.9 on 04/06/2016, 43.5 on 09/13/2016, 69.4 on 04/20/2018, 73.7 on 10/16/2019, and 77.1 on 11/25/2019.  The patient does not plan to receive the COVID-19 vaccine.  Symptomatically, he denies any new symptoms.  Exam is stable.  Plan: 1.   Labs:  [facility] genetic testing. 2.   Medullary thyroid carcinoma             Clinically, he denies any new symptoms.             His clinical course has been indolent.                         Kinase inhibitor therapy may not be appropriate for slowly progressive/indolent disease.                         Previously, he received vandetinib; vandetinib has a RR of about 37% in clinical trials.                         There are side effects with therapy which can affect quality of life.                                     Side effects include  increased QT, dermatologic toxicity, diarrhea, CHF, hypertension, hemorrhage,CVA, ILD, hypocalcemia.             Calcitonin and CEA have increased over time.             Discuss plan for comparison of PET scan at [facility] to current scan.                         For asymptomatic patients with tumor < 1-2 cm tumor growing < 20 %/year, guidelines suggest monitoring.                         For metastasis at least 1-2 cm, growing > 20 %/year or symptomatic, consider treatment.             Patient previously not considered  a surgical candidate.             Review interval tumor board discussion.  There has been interval growth.  Discuss plan for biopsy. 3.   [facility] genetic testing. 4.   Ultrasound guided biopsy. 5.   EKG- baseline. 6.   Elesa Massed: re vandatrnib coverage. 7.   RTC in 2 weeks for MD assessment, review of work-up, and discussion regarding direction of therapy.  I discussed the assessment and treatment plan with the patient.  The patient was provided an opportunity to ask questions and all were answered.  The patient agreed with the plan and demonstrated an understanding of the instructions.  The patient was advised to call back if the symptoms worsen or if the condition fails to improve as anticipated.  I provided 21 minutes of face-to-face time during this this encounter and > 50% was spent counseling as documented under my assessment and plan.   Laetitia Schnepf C. Mike Gip, MD, PhD    12/02/2019, 1:43 PM  I, Mirian Mo Tufford, am acting as Education administrator for [employer]. Mike Gip, MD, PhD.  I, Devynn Hessler C. Mike Gip, MD, have reviewed the above documentation for accuracy and completeness, and I agree with the above.

## 2019-12-02 ENCOUNTER — Encounter: Payer: Self-pay | Admitting: Hematology and Oncology

## 2019-12-02 ENCOUNTER — Other Ambulatory Visit: Payer: Self-pay

## 2019-12-02 ENCOUNTER — Other Ambulatory Visit: Payer: Self-pay | Admitting: Internal Medicine

## 2019-12-02 ENCOUNTER — Inpatient Hospital Stay: Payer: Medicare HMO | Attending: Hematology and Oncology | Admitting: Hematology and Oncology

## 2019-12-02 VITALS — BP 118/69 | HR 89 | Temp 98.6°F | Wt 172.4 lb

## 2019-12-02 DIAGNOSIS — C73 Malignant neoplasm of thyroid gland: Secondary | ICD-10-CM | POA: Diagnosis not present

## 2019-12-02 DIAGNOSIS — R9389 Abnormal findings on diagnostic imaging of other specified body structures: Secondary | ICD-10-CM | POA: Diagnosis not present

## 2019-12-02 NOTE — Progress Notes (Signed)
Pt eating ok, wants to go back on caprelsa.

## 2019-12-04 ENCOUNTER — Other Ambulatory Visit: Payer: Self-pay | Admitting: Hematology and Oncology

## 2019-12-04 ENCOUNTER — Other Ambulatory Visit: Payer: Self-pay

## 2019-12-04 ENCOUNTER — Ambulatory Visit
Admission: RE | Admit: 2019-12-04 | Discharge: 2019-12-04 | Disposition: A | Discharge status: Home / Self Care | Payer: Medicare HMO | Source: Ambulatory Visit | Attending: Cardiovascular Disease | Admitting: Cardiovascular Disease

## 2019-12-04 DIAGNOSIS — R9389 Abnormal findings on diagnostic imaging of other specified body structures: Secondary | ICD-10-CM

## 2019-12-04 DIAGNOSIS — C73 Malignant neoplasm of thyroid gland: Secondary | ICD-10-CM | POA: Diagnosis present

## 2019-12-06 ENCOUNTER — Telehealth: Payer: Self-pay

## 2019-12-06 NOTE — Telephone Encounter (Signed)
Left a message to inform the patient that he has been schedule for US biopsy on 12/11/2019 @ 2:00 pm for a 2:30 pm appointment. I will try to touch bases with the patient again until i reach him by phone to inform him of his appointment date and time.

## 2019-12-06 NOTE — Telephone Encounter (Signed)
I have called several times and unable to get a answer but i was able to leave on his voice mail but i will keep calling to get him on the phone to make sure he understanding the message i left for him. But i will keep trying. Also I have informed him Nothing to eat or drink 8 hours prior to bx and he will need a driver to take him back home.

## 2019-12-10 ENCOUNTER — Other Ambulatory Visit: Payer: Self-pay | Admitting: Radiology

## 2019-12-11 ENCOUNTER — Ambulatory Visit: Payer: Medicare HMO

## 2019-12-11 ENCOUNTER — Other Ambulatory Visit: Payer: Self-pay | Admitting: Hematology and Oncology

## 2019-12-11 ENCOUNTER — Other Ambulatory Visit: Payer: Self-pay

## 2019-12-11 ENCOUNTER — Ambulatory Visit
Admission: RE | Admit: 2019-12-11 | Discharge: 2019-12-11 | Disposition: A | Discharge status: Home / Self Care | Payer: Medicare HMO | Source: Ambulatory Visit | Attending: Hematology and Oncology | Admitting: Hematology and Oncology

## 2019-12-11 DIAGNOSIS — C73 Malignant neoplasm of thyroid gland: Secondary | ICD-10-CM

## 2019-12-11 DIAGNOSIS — E890 Postprocedural hypothyroidism: Secondary | ICD-10-CM | POA: Insufficient documentation

## 2019-12-11 DIAGNOSIS — R9389 Abnormal findings on diagnostic imaging of other specified body structures: Secondary | ICD-10-CM

## 2019-12-11 NOTE — Procedures (Signed)
Interventional Radiology Procedure Note  Procedure: [place] BX RIGHT SUPRACLAVICULAR NODE    Complications: None  Estimated Blood Loss:  MIN  Findings: 75 G CORES    MDaryll Brod, MD

## 2019-12-11 NOTE — Discharge Instructions (Signed)
Thyroid Needle Biopsy, Care After This sheet gives you information about how to care for yourself after your procedure. Your health care provider may also give you more specific instructions. If you have problems or questions, contact your health care provider. What can I expect after the procedure? After the procedure, it is common to have:  Soreness and tenderness that lasts for a few days.  Bruising where the needle was inserted (puncture site). Follow these instructions at home:   Take over-the-counter and prescription medicines only as told by your health care provider.  To help ease discomfort, keep your head raised (elevated) when you are lying down. When you move from lying down to sitting up, use both hands to support the back of your head and neck.  Check your puncture site every day for signs of infection. Check for: ? Redness, swelling, or pain. ? Fluid or blood. ? Warmth. ? Pus or a bad smell.  Return to your normal activities as told by your health care provider. Ask your health care provider what activities are safe for you.  Keep all follow-up visits as told by your health care provider. This is important. Contact a health care provider if:  You have redness, swelling, or pain around your puncture site.  You have fluid or blood coming from your puncture site.  Your puncture site feels warm to the touch.  You have pus or a bad smell coming from your puncture site.  You have a fever. Get help right away if:  You have severe bleeding from the puncture site.  You have difficulty swallowing.  You have swollen glands (lymph nodes) in your neck. Summary  It is common to have some bruising and soreness where the needle was inserted in your lower front neck area (puncture site).  Check your puncture site every day for signs of infection, such as redness, swelling, or pain.  Get help right away if you have severe bleeding from your puncture site. This  information is not intended to replace advice given to you by your health care provider. Make sure you discuss any questions you have with your health care provider. Document Revised: 01/27/2017 Document Reviewed: 11/28/2016 Elsevier Patient Education  2020 [employer]  

## 2019-12-13 ENCOUNTER — Ambulatory Visit: Payer: Medicare HMO

## 2019-12-16 ENCOUNTER — Inpatient Hospital Stay: Payer: Medicare HMO | Admitting: Hematology and Oncology

## 2019-12-18 ENCOUNTER — Ambulatory Visit: Payer: Medicare HMO | Admitting: Hematology and Oncology

## 2019-12-20 ENCOUNTER — Emergency Department
Admission: EM | Admit: 2019-12-20 | Discharge: 2019-12-20 | Disposition: A | Discharge status: Home / Self Care | Payer: Medicare HMO | Attending: Emergency Medicine | Admitting: Emergency Medicine

## 2019-12-20 ENCOUNTER — Other Ambulatory Visit: Payer: Self-pay

## 2019-12-20 DIAGNOSIS — K629 Disease of anus and rectum, unspecified: Secondary | ICD-10-CM | POA: Diagnosis not present

## 2019-12-20 DIAGNOSIS — L0232 Furuncle of buttock: Secondary | ICD-10-CM | POA: Insufficient documentation

## 2019-12-20 DIAGNOSIS — E039 Hypothyroidism, unspecified: Secondary | ICD-10-CM | POA: Diagnosis not present

## 2019-12-20 DIAGNOSIS — K6289 Other specified diseases of anus and rectum: Secondary | ICD-10-CM | POA: Diagnosis present

## 2019-12-20 DIAGNOSIS — Z85850 Personal history of malignant neoplasm of thyroid: Secondary | ICD-10-CM | POA: Diagnosis not present

## 2019-12-20 DIAGNOSIS — Z79899 Other long term (current) drug therapy: Secondary | ICD-10-CM | POA: Insufficient documentation

## 2019-12-20 LAB — SURGICAL PATHOLOGY

## 2019-12-20 MED ORDER — LIDOCAINE HCL 2 % EX GEL: 1.0000 "application " | CUTANEOUS | 0 refills | Status: DC | PRN

## 2019-12-20 NOTE — ED Provider Notes (Signed)
[facility] Emergency Department Provider Note  ____________________________________________  Time seen: Approximately 4:56 PM  I have reviewed the triage vital signs and the nursing notes.   HISTORY  Chief Complaint Abscess   HPI Grant Freeman is a 85 y.o. male with history of thyroid cancer presenting to the treatment and evaluation of a perirectal lesion.  Patient states that he had a biopsy performed November 06, 2019 in the general surgeon's office.  Patient states that the surgeon "missed the whole thing."  He states that he can still feel a boil and wants it lanced here in the emergency department today.  He states that the area is intermittently painful.  He has not noticed any bleeding or drainage and denies known fever.  Past Medical History:  Diagnosis Date  . Thyroid cancer, medullary carcinoma [facility])     Patient Active Problem List   Diagnosis Date Noted  . 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 Hypothyroidism 07/13/2011  . Thyroid cancer, medullary carcinoma ([place]) 07/13/2011    Past Surgical History:  Procedure Laterality Date  . THYROIDECTOMY      Prior to Admission medications   Medication Sig Start Date End Date Taking? Authorizing Provider  acetaminophen (TYLENOL) 500 MG tablet Take by mouth. Patient not taking: Reported on 10/29/2019    [provider]  hydrOXYzine (VISTARIL) 25 MG capsule Take 25 mg by mouth every 6 (six) hours as needed.  06/06/19   [provider]  levothyroxine (SYNTHROID, LEVOTHROID) 112 MCG tablet Take 1 tablet by mouth daily. 10/12/15   [provider]  lidocaine (XYLOCAINE) 2 % jelly Apply 1 application topically as needed. 12/20/19   Dustina Scoggin B, FNP  MELATONIN PO Take 1 Dose by mouth at bedtime as needed.     [provider]  saw palmetto 80 MG capsule Take 80 mg by mouth 2 (two) times daily. Patient not taking: Reported on 10/29/2019    [provider]   vandetanib (CAPRELSA) 300 MG tablet Take 1 tablet (300 mg total) by mouth daily. Patient not taking: Reported on 10/29/2019 05/26/16   Lequita Asal, MD    Allergies Penicillins  No family history on file.  Social History Social History   Tobacco Use  . Smoking status: Never Smoker  . Smokeless tobacco: Former Network engineer  . Vaping Use: Never used  Substance Use Topics  . Alcohol use: Yes    Alcohol/week: 2.0 standard drinks    Types: 2 Cans of beer per week  . Drug use: No    Review of Systems  Constitutional: Negative for fever. Respiratory: Negative for cough or shortness of breath.  Musculoskeletal: Negative for myalgias Skin: Positive for lesion Neurological: Negative for numbness or paresthesias. ____________________________________________   PHYSICAL EXAM:  VITAL SIGNS: ED Triage Vitals [12/20/19 1418]  Enc Vitals Group     BP (!) 144/84     Pulse Rate 95     Resp 16     Temp 97.9 F (36.6 C)     Temp Source Oral     SpO2 96 %     Weight 171 lb 15.3 oz (78 kg)     Height 5\' 10"  (1.778 m)     Head Circumference      Peak Flow      Pain Score 8     Pain Loc      Pain Edu?      Excl. in [place]?      Constitutional:  Chronically ill appearing. Eyes: Conjunctivae are clear without discharge or drainage. Nose: No rhinorrhea noted. Mouth/Throat: Airway is patent.  Neck: No stridor. Unrestricted range of motion observed. Cardiovascular: Capillary refill is <3 seconds.  Respiratory: Respirations are even and unlabored.. Musculoskeletal: Unrestricted range of motion observed. Neurologic: Awake, alert, and oriented x 4.  Skin: 1 cm round, firm umbilicated lesion at approximately the 4 o'clock position perirectal.  Lesion is nonfluctuant.  Lesion is not erythematous.  There is no drainage in the area.  Skin appears macerated.  ____________________________________________   LABS (all labs ordered are listed, but only abnormal results are  displayed)  Labs Reviewed - No data to display ____________________________________________  EKG  Not indicated. ____________________________________________  RADIOLOGY  Not indicated ____________________________________________   PROCEDURES  Procedures ____________________________________________   INITIAL IMPRESSION / ASSESSMENT AND PLAN / ED COURSE  Grant Freeman is a 85 y.o. male presenting to the emergency department in hopes of having a perianal lesion excised.  See HPI and exam for further details.  Procedure note from general surgery reviewed as well as results from biopsy.  In regard to patient feeling that the incorrect lesion was excised, he will need to follow-up with surgery to have that discussion.  Lesion is not an abscess/boil.  The patient and I discussed this at length.  He still seems to feel that the area needs to be "cut open and squeezed."  No procedure will be performed today as he was advised that this will need to be discussed with surgery or primary care.  He will be given some lidocaine gel to apply to the area when it is bothersome.   Medications - No data to display   Pertinent labs & imaging results that were available during my care of the patient were reviewed by me and considered in my medical decision making (see chart for details).  ____________________________________________   FINAL CLINICAL IMPRESSION(S) / ED DIAGNOSES  Final diagnoses:  Anal lesion    ED Discharge Orders         Ordered    lidocaine (XYLOCAINE) 2 % jelly  As needed        12/20/19 1611           Note:  This document was prepared using Dragon voice recognition software and may include unintentional dictation errors.   Victorino Dike, FNP 12/20/19 1856    Merlyn Lot, MD 12/20/19 2029

## 2019-12-20 NOTE — ED Triage Notes (Signed)
Reports "boil" to right buttocks X 1 month.

## 2019-12-23 ENCOUNTER — Ambulatory Visit: Payer: Medicare HMO | Admitting: Hematology and Oncology

## 2019-12-23 DIAGNOSIS — C73 Malignant neoplasm of thyroid gland: Secondary | ICD-10-CM

## 2019-12-24 ENCOUNTER — Ambulatory Visit: Payer: Medicare HMO | Admitting: Surgery

## 2019-12-24 ENCOUNTER — Other Ambulatory Visit: Payer: Self-pay

## 2019-12-24 ENCOUNTER — Encounter: Payer: Self-pay | Admitting: Surgery

## 2019-12-24 VITALS — BP 121/77 | HR 118 | Temp 98.0°F | Ht 70.0 in | Wt 171.0 lb

## 2019-12-24 DIAGNOSIS — K603 Anal fistula: Secondary | ICD-10-CM

## 2019-12-24 DIAGNOSIS — K602 Anal fissure, unspecified: Secondary | ICD-10-CM

## 2019-12-24 NOTE — Progress Notes (Signed)
Patient ID: Grant Freeman, male   DOB: 05-27-1934, 85 y.o.   MRN: [id number]  Chief Complaint: Anal pain  History of Present Illness Grant Freeman is a 85 y.o. male who presents with a degree of challenge in regard to obtaining a complete thorough history.  Who complains primarily of anal rectal pain, associated soilage, a fixation on draining some form of boil in the area.  He had a biopsy of a perianal skin lesion early in September that confirmed only inflammatory fibrous changes.  He has been utilizing alcohol to wipe his anal region and complains of excessive itching as well.  He says he has not had a colonoscopy in 100 years if he has had one at all.  He denies any bleeding, fevers or chills.  He reports daily bowel movements.  He denies taking any fiber supplementation.  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  He is being followed by Dr. Mike Gip for this.  Past Medical History Past Medical History:  Diagnosis Date  . Thyroid cancer, medullary carcinoma [facility])       Past Surgical History:  Procedure Laterality Date  . THYROIDECTOMY      Allergies  Allergen Reactions  . Penicillins Hives    Current Outpatient Medications  Medication Sig Dispense Refill  . levothyroxine (SYNTHROID, LEVOTHROID) 112 MCG tablet Take 1 tablet by mouth daily.    Marland Kitchen lidocaine (XYLOCAINE) 2 % jelly Apply 1 application topically as needed. 30 mL 0   No current facility-administered medications for this visit.    Family History No family history on file.    Social History Social History   Tobacco Use  . Smoking status: Never Smoker  . Smokeless tobacco: Former Network engineer  . Vaping Use: Never used  Substance Use Topics  . Alcohol use: Yes    Alcohol/week: 2.0 standard drinks    Types: 2 Cans of beer per week  . Drug use: No        Review of Systems  Unable to perform ROS: Mental acuity  He checked none on the entire review of systems,  technically 10 areas typically reviewed: Constitution, skin, HEENT, eyes, cardiovascular, neurological, respiratory, gastrointestinal, urinary, psychiatric systems were reviewed without acknowledgment of any issues.    Physical Exam Blood pressure 121/77, pulse (!) 118, temperature 98 F (36.7 C), height 5\' 10"  (1.778 m), weight 171 lb (77.6 kg), SpO2 96 %. Last Weight  Most recent update: 12/24/2019  9:42 AM   Weight  77.6 kg (171 lb)            CONSTITUTIONAL: Elderly male, appropriately responsive and aware without distress.  Requiring additional explanation, though not entirely confused, easily confused by instructions and game plan. EYES: Sclera non-icteric.   EARS, NOSE, MOUTH AND THROAT: Mask worn.   Hearing is intact to voice.  NECK: Trachea is midline, and there is no jugular venous distension.  known supraclavicular metastases. RESPIRATORY:  Lungs are clear, and breath sounds are equal bilaterally. Normal respiratory effort without pathologic use of accessory muscles. CARDIOVASCULAR: Heart is regular in rate and rhythm. GI: The abdomen is soft, nontender, and nondistended.  GU: There is some excoriation/evidence of chronic dermal wetness in the perianal region.  Clean without evidence of soilage.  There is a very prominent indurated sentinel tag at 12:00, correlating with the coccyx.  There seems to be a very external fissure associated.  At 1:00 there is a nearly healed external os of  what feels like a superficial fistula that extends within the anal canal, but seems to be superficial to the anal sphincteric mechanism.  I did not feel any significant internal masses, nor hemorrhoids.  I do not appreciate any fluctuance, induration or active abscess process in the region.  During my exam he did not indicate that he had had any of these area biopsied, though on review of the notes I see that Dr. Peyton Najjar had biopsied something here early in September. [facility]  Comment  Specimen A-Skin  Excision, perianal: FIBROSIS AND  INFLAMMATION CONSISTENT WITH SCAR, SEE COMMENT   MUSCULOSKELETAL:  Symmetrical muscle tone appreciated in all four extremities.    SKIN: Skin turgor is normal. No pathologic skin lesions appreciated.  NEUROLOGIC:  Motor and sensation appear grossly normal.  Cranial nerves are grossly without defect. PSYCH:  Alert and oriented to person, place and time. Affect is appropriate for situation.  Seems rather perseverance and does not give certain that he understands all that were dealing with.  Data Reviewed I have personally reviewed what is currently available of the patient's imaging, recent labs and medical records.   Labs:  CBC Latest Ref Rng & Units 11/25/2019 04/06/2016 12/22/2015  WBC 4.0 - 10.5 K/uL 5.8 4.4 4.9  Hemoglobin 13.0 - 17.0 g/dL 16.0 14.4 14.8  Hematocrit 39 - 52 % 47.1 40.8 42.7  Platelets 150 - 400 K/uL 290 267 229   CMP Latest Ref Rng & Units 11/25/2019 11/22/2019 04/06/2016  Glucose 70 - 99 mg/dL 107(H) - 126(H)  BUN 8 - 23 mg/dL 13 - 12  Creatinine 0.61 - 1.24 mg/dL 0.83 0.80 0.80  Sodium 135 - 145 mmol/L 136 - 136  Potassium 3.5 - 5.1 mmol/L 4.5 - 4.2  Chloride 98 - 111 mmol/L 103 - 105  CO2 22 - 32 mmol/L 24 - 24  Calcium 8.9 - 10.3 mg/dL 9.2 - 9.4  Total Protein 6.5 - 8.1 g/dL 8.1 - 7.3  Total Bilirubin 0.3 - 1.2 mg/dL 1.1 - 0.5  Alkaline Phos 38 - 126 U/L 55 - 59  AST 15 - 41 U/L 33 - 30  ALT 0 - 44 U/L 45(H) - 16(L)      Imaging:  Within last 24 hrs: No results found.  Assessment    Metastatic medullary carcinoma. Anal fissure, chronic. Anal fistula, chronic. Anal rectal pain somewhat out of proportion to the above. Patient Active Problem List   Diagnosis Date Noted  . 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 Hypothyroidism 07/13/2011  . Thyroid cancer, medullary carcinoma ([place]) 07/13/2011    Plan    MRI of the anal rectal area to evaluate for additional fistulae, pathology. Topical nifedipine to treat any spasm  related to the chronicity of his anal fissure. Fiber supplementation to help with his bowel activity, hopefully to better formed stools and cause less irritation/soilage in the area. I advised he stop utilizing alcohol topically, and change to a Tucks pads/witch hazel to help with cleansing the area and keeping swelling down. I discussed with him we may eventually progressed to a rectal exam under anesthesia for unroofing his somewhat superficial anal fistula, but would likely not address his anal fissure during the same procedure as I do not believe there is a significant degree of anal sphincteric spasm contributing to it.  Not sure exactly how much he understood, I am hoping he will follow through with his cancer treatment/evaluation.  Face-to-face time spent with the patient and accompanying care providers(if present) was 50  minutes, with more than 50% of the time spent counseling, educating, and coordinating care of the patient.      Ronny Bacon M.D., FACS 12/24/2019, 11:33 AM

## 2019-12-24 NOTE — Patient Instructions (Addendum)
You are scheduled for a pelvic MRI at [facility] on Wednesday November 3rd at 3:30 pm. You will need to arrive there at 3:15 pm.   Please begin the Nifedipine medication sent to [facility] in [place]. You will apply this to your rectal area 3 times a day for 6 weeks. The cost of this is $45.00. [facility] is located at [address] [place], [place] [address]. Their phone number is [phone]. This is the only pharmacy that will compound this particular medication. They will call you once the prescription is ready.   Do not use alcohol to the rectal area. You may use Witch Hazel(Tucks pads) instead.   We will set you up for surgery to do an exam under anesthesia.  Please see your Blue surgery sheet for more information about surgery. Our surgery scheduler will be in contact with you to look at surgery dates and to go over information.  Try to keep your stools soft. You should start taking a fiber supplement like Benefiber or Metamucil in a glass of fluid daily. You should do a sitz bath several times a day after every bowel movement.    How to Take a Sitz Bath A sitz bath is a warm water bath that is taken while you are sitting down. The water should only come up to your hips and should cover your buttocks. Your health care provider may recommend a sitz bath to help you:  Clean the lower part of your body, including your genital area.  With itching.  With pain.  With sore muscles or muscles that tighten or spasm.  How to take a sitz bath Take 3-4 sitz baths per day or as told by your health care provider. 1. Partially fill a bathtub with warm water. You will only need the water to be deep enough to cover your hips and buttocks when you are sitting in it. 2. If your health care provider told you to put medicine in the water, follow the directions exactly. 3. Sit in the water and open the tub drain a little. 4. Turn on the warm water again to keep the tub at the  correct level. Keep the water running constantly. 5. Soak in the water for 15-20 minutes or as told by your health care provider. 6. After the sitz bath, pat the affected area dry first. Do not rub it. 7. Be careful when you stand up after the sitz bath because you may feel dizzy.  Contact a health care provider if:  Your symptoms get worse. Do not continue with sitz baths if your symptoms get worse.  You have new symptoms. Do not continue with sitz baths until you talk with your health care provider. This information is not intended to replace advice given to you by your health care provider. Make sure you discuss any questions you have with your health care provider. Document Released: 11/07/2003 Document Revised: 07/15/2015 Document Reviewed: 02/12/2014 Elsevier Interactive Patient Education  [employer].

## 2019-12-31 NOTE — Progress Notes (Deleted)
[facility]  [address] [place], [place] [address] Phone: [phone]  Fax: [phone]   Clinic Day:  12/31/2019  Referring physician: Leonel Ramsay, MD  Chief Complaint: Grant Freeman is a 85 y.o. male with medullary thyroid cancer who is seen for review of interm biopsy, [facility] genetic testing, and discussion regarding direction of therapy.   HPI: The patient was last seen in the medical oncology clinic on 12/02/2019. At that time, he denied any new symptoms. Exam was stable. We discussed plans for a ultrasound guided biopsy and [facility] genetic testing.   Right supraclavicular biopsy on 12/11/2019 confirmed metastatic carcinoma.  IHC for calcitonin was diffuse and strong staining confirming the diagnosis of metastatic medullary carcinoma of the thyroid.  Patient was seen in the [facility] ER on 12/20/2019 for a peri-rectal abscess.  Patient stated that he had a biopsy on 11/06/2019 in the general surgeon's office.  Patient stated that the surgeon "missed the whole thing".  He stated that he can still feel a boil and wanted it lanced in the emergency department. The area was intermittently painful.  He denied any bleeding or drainage; he denied fever. Exam revealed a 1 cm round, firm umbilicated lesion at 4 o'clock position in the peri-rectal area.  Lesion was non-fluctuant and non-erythematous.  Follow up with general surgery was recommended.   He saw Dr Christian Mate on 12/24/2019.  He noted anal rectal pain, soilage and concern about a raining boil.  Biopsy in 10/2019 confirmed only inflammatory fibrous changes he had excessive pruritus in this area and was utilizing alcohol wipes exam revealed some excoriation/evidence of chronic dermal wetness in the perianal region.  There was a very prominent indurated sentinel tag at 12:00 seem to be very external fissure.  At 1:00, there appeared to be a nearly healed external os of the superficial fistula extends  within the anal cana.  Recommendations included an MRI of the anal rectal area, topical nifedipine to treat any spasm, fiber supplementation, and use of Tucks pads with witch hazel rather than alcohol topically.  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  Variants of uncertain significance included: ALK c.1184G>A (p.Arg395His) and NF1 c.5288A>T (p.Tyr1763Phe).  During the interim, ***   Past Medical History:  Diagnosis Date  . Thyroid cancer, medullary carcinoma [facility])     Past Surgical History:  Procedure Laterality Date  . THYROIDECTOMY      No family history on file.  Social History:  reports that he has never smoked. He has quit using smokeless tobacco. He reports current alcohol use of about 2.0 standard drinks of alcohol per week. He reports that he does not use drugs. The patient drinks beer or wine a couple times per weeks. Denies tobacco or drug use. He still lives in [place]. He is a retired Clinical biochemist. The patient is alone today.  Allergies:  Allergies  Allergen Reactions  . Penicillins Hives    Current Medications: Current Outpatient Medications  Medication Sig Dispense Refill  . levothyroxine (SYNTHROID, LEVOTHROID) 112 MCG tablet Take 1 tablet by mouth daily.    Marland Kitchen lidocaine (XYLOCAINE) 2 % jelly Apply 1 application topically as needed. 30 mL 0   No current facility-administered medications for this visit.  Review of Systems  Constitutional: Negative for chills, diaphoresis, fever, malaise/fatigue and weight loss (up 1 lb).  HENT:  Negative for congestion, ear discharge, ear pain, hearing loss, nosebleeds, sinus pain, sore throat and tinnitus.        Hoarse voice.  Eyes: Negative for blurred vision.  Respiratory: Negative for cough, hemoptysis, sputum production and shortness of breath.     Cardiovascular: Negative for chest pain, palpitations and leg swelling.  Gastrointestinal: Negative for abdominal pain, blood in stool, constipation, diarrhea, heartburn, melena, nausea and vomiting.       Consuming baking powder and maple syrup twice weekly.  Genitourinary: Negative for dysuria, frequency, hematuria and urgency.  Musculoskeletal: Negative for back pain, joint pain, myalgias and neck pain.  Skin: Negative for itching and rash.  Neurological: Negative for dizziness, tingling, sensory change, weakness and headaches.  Endo/Heme/Allergies: Does not bruise/bleed easily.  Psychiatric/Behavioral: Negative for depression and memory loss. The patient is not nervous/anxious and does not have insomnia.   All other systems reviewed and are negative.  Performance status (ECOG): 1  Vitals There were no vitals taken for this visit.   Physical Exam Vitals and nursing note reviewed.  Constitutional:      General: He is not in acute distress.    Appearance: He is not diaphoretic.  Eyes:     General: No scleral icterus.    Conjunctiva/sclera: Conjunctivae normal.  Neurological:     Mental Status: He is alert and oriented to person, place, and time.  Psychiatric:        Behavior: Behavior normal.        Thought Content: Thought content normal.        Judgment: Judgment normal.    No visits with results within 3 Day(s) from this visit.  Latest known visit with results is:  Hospital Outpatient Visit on 12/11/2019  Component Date Value Ref Range Status  . SURGICAL PATHOLOGY 12/11/2019    Final-Edited                   Value:SURGICAL PATHOLOGY **** THIS IS AN ADDENDUM REPORT **** CASE: [id number] PATIENT: [name] Surgical Pathology Report **********Addendum **********  Reason for Addendum #1:  Immunohistochemistry results  Specimen Submitted: A. Lymph node, right supraclavicular; biopsy  Clinical History: Medullary carcinoma of the thyroid with  recurrence. Previous thyroidectomy at [facility] in 2002 with positive lymph nodes, and resection of nodal metastases at [facility] in 2006 and 2012. Post chemotherapy and kinase inhibitor therapy.     DIAGNOSIS: A. LYMPH NODE, RIGHT SUPRACLAVICULAR; ULTRASOUND-GUIDED CORE BIOPSY: - METASTATIC CARCINOMA, SEE COMMENT.  Comment: A few very tiny deposits of metastatic carcinoma are present, confirmed by cytokeratin immunohistochemistry (IHC) (AE1/AE3/PCK26). The tumor cells show nested growth, minimal pleomorphism, and absent mitotic figures, morphologically compatible with metastatic medullary carcinoma of the thyroid.  Calcitonin IHC has b                         een ordered, and the results will be resulted in an addendum.  Cytokeratin IHC slides were prepared by Launa Grill, [place]. All controls stained appropriately.  This test was developed and its performance characteristics determined by [facility]. It has not been cleared or approved by the [place] Food and Drug Administration. The FDA does not require this test to go through premarket FDA review. This test is used for clinical purposes. It should not be regarded as investigational or for research. This laboratory is certified under the Clinical Laboratory Improvement Amendments (CLIA) as qualified to perform high complexity clinical laboratory testing.  GROSS DESCRIPTION: A. Labeled: Right supraclavicular  node Received: Formalin Number of needle core biopsy(s): 3 cores with multiple additional fragments Length: Range from 0.7 to 0.9 cm Diameter: 0.1 cm Description: Received are cores and fragments of tan-gray soft tissue. The fragments are 0.7 x 0.5 x 0.1 cm in aggregat                         e. Ink: None Entirely submitted in cassettes 1-4 with 1 core per cassettes 1-3 and the remaining fragments in cassette 4.    Final Diagnosis performed by Bryan Lemma, MD.   Electronically signed 12/16/2019 12:42:13PM The electronic  signature indicates that the named Attending Pathologist has evaluated the specimen Technical component performed at [facility], [address] [place], [place] [address] Lab: [phone] Dir: Rush Farmer, MD, MMM  Professional component performed at [facility], [facility], [place], [place], [place] [address] Lab: [phone] Dir: Dellia Nims. Reuel Derby, MD  ADDENDUM: IHC for calcitonin was performed. The tumor cells are positive for calcitonin with diffuse and strong staining. The result confirms the diagnosis of metastatic medullary carcinoma of the thyroid.  IHC slides were prepared by [place] [employer], [place], [place] All controls stained appropriately.  This test was develope                         d and its performance characteristics determined by [facility]. It has not been cleared or approved by the [place] Food and Drug Administration. The FDA does not require this test to go through premarket FDA review. This test is used for clinical purposes. It should not be regarded as investigational or for research. This laboratory is certified under the Clinical Laboratory Improvement Amendments (CLIA) as qualified to perform high complexity clinical laboratory testing.    Addendum #1 performed by Bryan Lemma, MD.   Electronically signed 12/20/2019 12:01:27PM The electronic signature indicates that the named Attending Pathologist has evaluated the specimen Technical component performed at [facility], [address] [place], [place] [address] Lab: [phone] Dir: Rush Farmer, MD, MMM  Professional component performed at [facility], [facility], [place], [place], [place] [address] Lab: [phone] Dir: Dellia Nims. Rubinas, MD     Assessment:  ORAN DILLENBURG is a 85 y.o. male with stage IV medullary thyroid cancer s/p medullary thyroid cancer s/p bilateral thyroidectomy 07/06/2000.  Pathology revealed a 5.2 cm  medullary carcinoma. Cancer extended up to but not through the thyroid capsule. 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 Right sentinel lymph node was negative. Left central neck lymph node was positive with a small focus of metastatic medullary carcinoma. Two lymph nodes in the left upper mediastinum were negative for cancer and a third paratracheal lymph node was negative.  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  Variants of uncertain significance included: ALK c.1184G>A (p.Arg395His) and NF1 c.5288A>T (p.Tyr1763Phe).  He received an unknown number cycles of carboplatin and Taxol beginning in 11/2001.  He developed recurrent disease in 2006. He underwent right neck dissection on 10/04/2004.  There was a 4.2 cm soft tissue mass of recurrent medullary thyroid cancer. Four of 4 right-sided neck nodes were positive.  The largest node was 4.2 cm with extracapsular extension into the soft tissue.  A right neck level IIB dissection revealed 8 of  11 lymph nodes positive for metastatic thyroid cancer. The largest focus was 5 mm with tumor present in perinodal lymphatics.  He received DTIC (dacarbazine) with some improvement in serum markers in 2010.  PET scan on 03/11/2010 revealed increased FDG uptake in the left base of neck evidence of distant metastasis.  Notes indicate additional neck dissection in 2012 for recurrent disease.  He received vandetanib from 05/2009 through 05/17/2010. Treatment was interrupted in 12/2009 secondary to an infection in his arm. Dose was reduced from 300 mg a day to 02/2010 at 200 mg a day in 02/2010. He believes his tumor marker dramatically improved with therapy.  He received no TKI therapy between 2012 in 2014 secondary to patient refusal. Calcitonin remained in the  875 range in 03/2012.  In 03/2012, he began ingesting and applying "blood root" to his neck.  PET scan on 10/18/2012 revealed an increase in a 3.21m nodule to 597m but no visible disease in neck, mediastinum or abdomen.  Recommendation was for no vandetanib.  PET scan on 04/01/2016 was suboptimal secondary to altered biomechanics after recent food ingestion prior to the scan. There was new retrotracheal, paraesophageal soft tissue nodule without increased metabolic activity (may falsely lower SUV secondary to altered biomechanics).There was unchanged subcentimeter pulmonary nodule in the right upper lobe which was below the size threshold for PET imaging.  Neck soft tissue CT scan on 04/01/2016 revealed new 1.2 x 0.9 cm retrotracheal, paraesophageal enhancing soft tissue nodule suspicious for metastatic disease, but does not appear to be associated with definite increased metabolic activity.  There was redemonstration of a 1.6 x 0.9 cm enhancing soft tissue nodules in the right thyroid bed and adjacent to the cricothyroid cartilage.  There was no significant interval change in size of right 6 mm apical pulmonary nodule.  [facility] revealed redemonstrated minimally FDG avid retrotracheal, paraesophageal soft tissue with limited comparison to prior study given previous muscle uptake. This remained concerning for possible metastatic disease. There was unchanged CT appearance of enhancing cricothyroid soft tissue also with mild FDG avidity, likely ectopic thyroid tissue or level VI lymph nodes;  metastatic disease was not excluded. There was no evidence of new FDG avid disease.  Diagnostic neck CT on 09/30/2016 revealed stable appearance of right retrotracheal, paraesophageal enhancing soft tissue nodule (1.3 x 0.9 cm compared to 1.2 x 0.9 cm). This lesion was suspicious for metastatic disease. Attention on follow-up.  There was redemonstration of extensive enhancing soft tissue  nodules in the right thyroid bed and adjacent the cricothyroid cartilage which were unchanged in size and may represent residual or regenerated thyroid tissue.  There was opacification of the maxillary, ethmoid and frontal sinuses which may be seen in the setting of sinusitis.  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   PET scan on 11/21/2019 noted diffuse low level hypermetabolism seen in the left thyroid cartilage region including asymmetric soft tissue just superficial to the cartilage. This may reflect the FDG avid "cricopharyngeal soft tissue" described in the outside report from 2018. Metastatic disease remained a concern. There was a 1.9 cm right retrotracheal soft tissue nodule in the region of the thoracic inlet shows low level hypermetabolism, concerning for metastatic disease. There was a 9 mm anterior right upper lobe pulmonary nodule increased from 4 mm on the study 9 years ago.  There was no hypermetabolism. Relatively slow growth would suggest a benign etiology, but follow-up may be warranted to exclude low-grade, poorly FDG avid neoplasm.  There was a 5 mm right lower lobe pulmonary nodule new since 2012. Continue attention on follow-up recommended. There was hepatic steatosis.  Soft tissue neck CT on 11/22/2019 revealed a 2 cm mass at the right tracheoesophageal groove  new from PET scan in 2012, presumed local recurrence. A 7 x 17 mm nodule anterior to the upper cricoid cartilage was also worrisome, but not hypermetabolic. There was a 11 mm lymph node at the junction of the right supraclavicular fossa and IJ chain, larger than in 2012 and suspicious.  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 There was a right upper lobe pulmonary nodule, reference preceding PET-CT.  Right supraclavicular biopsy on 12/11/2019 confirmed metastatic carcinoma.  IHC for calcitonin was diffuse and strong staining confirming the diagnosis of metastatic medullary carcinoma of the thyroid.  Calcitonin has  been followed:  2,721 on 05/24/2010, 706 on 08/11/2010, 714 on 10/06/2010, 768 on 11/26/2010, 1,013 on 07/14/2011, 893 on 04/03/2012, 956 on 06/04/2012, 653 on 10/01/2012, 967 on 10/18/2012, 601 on 12/03/2012, 753 and 11/05/2013, 1,121 on 02/06/2014, 833 on 05/06/2014, 1321 on 08/19/2014, 1171 on 03/10/2016, 1411 on 12/22/2015, 1,254 on 04/06/2016, 1260 on 09/13/2016, 2000 on 04/20/2018, 4831 on 10/16/2019, and 4975.0 on 11/25/2019.  CEA has been followed:  43 and 09/16/2009, 40.8 on 10/05/2009, 38.5 on 11/09/2009, 46.6 on 12/16/2009, 36.9 on 01/27/2010, 40.1 on 02/23/2010, 42.1 on 03/25/2010, 47.9 on 04/20/2010, 44.6 on 05/24/2010, 22.2 on 08/11/2010, 15.2 on 10/06/2010, 18.4 on 11/26/2010, 19.4 on 07/14/2011, 23 on 04/03/2012, 22.6 on 10/01/2012, 35.4 on 12/22/2015, 43.9 on 04/06/2016, 43.5 on 09/13/2016, 69.4 on 04/20/2018, 73.7 on 10/16/2019, and 77.1 on 11/25/2019.  The patient does not plan to receive the COVID-19 vaccine.  Symptomatically, ***  Plan: 1.   Review work up.   2.   Medullary thyroid carcinoma             Clinically, he denies any new symptoms.             His clinical course has been indolent.                         Kinase inhibitor therapy may not be appropriate for slowly progressive/indolent disease.                         Previously, he received vandetinib; vandetinib has a RR of about 37% in clinical trials.                         There are side effects with therapy which can affect quality of life.                                     Side effects include increased QT, dermatologic toxicity, diarrhea, CHF, hypertension, hemorrhage,CVA, ILD, hypocalcemia.             Calcitonin and CEA have increased over time.             Discuss plan for comparison of PET scan at [facility] to current scan.                         For asymptomatic patients with tumor < 1-2 cm tumor growing < 20 %/year, guidelines suggest monitoring.  For metastasis at least 1-2 cm,  growing > 20 %/year or symptomatic, consider treatment.             Patient previously not considered a surgical candidate.             Review interval tumor board discussion.  There has been interval growth.  Discuss plan for biopsy. 3.   [facility] genetic testing. 4.   Ultrasound guided biopsy. 5.   EKG- baseline. 6.   Elesa Massed: re vandatrnib coverage. 7.   RTC in 2 weeks for MD assessment, review of work-up, and discussion regarding direction of therapy.  I discussed the assessment and treatment plan with the patient.  The patient was provided an opportunity to ask questions and all were answered.  The patient agreed with the plan and demonstrated an understanding of the instructions.  The patient was advised to call back if the symptoms worsen or if the condition fails to improve as anticipated.  I provided *** minutes of face-to-face time during this encounter and > 50% was spent counseling as documented under my assessment and plan.    Jamar Casagrande C. Mike Gip, MD, PhD    12/31/2019, 1:28 PM  I, Selena Batten, am acting as scribe for [employer]. Mike Gip, MD, PhD.  {Add scribe attestation statement}

## 2020-01-01 ENCOUNTER — Encounter: Payer: Self-pay | Admitting: Hematology and Oncology

## 2020-01-01 ENCOUNTER — Inpatient Hospital Stay: Payer: Medicare HMO | Attending: Hematology and Oncology | Admitting: Hematology and Oncology

## 2020-01-01 ENCOUNTER — Other Ambulatory Visit: Payer: Self-pay

## 2020-01-01 ENCOUNTER — Ambulatory Visit
Admission: RE | Admit: 2020-01-01 | Discharge: 2020-01-01 | Disposition: A | Discharge status: Home / Self Care | Payer: Medicare HMO | Source: Ambulatory Visit | Attending: Surgery | Admitting: Surgery

## 2020-01-01 DIAGNOSIS — K602 Anal fissure, unspecified: Secondary | ICD-10-CM | POA: Diagnosis not present

## 2020-01-01 DIAGNOSIS — C73 Malignant neoplasm of thyroid gland: Secondary | ICD-10-CM

## 2020-01-01 DIAGNOSIS — K603 Anal fistula: Secondary | ICD-10-CM | POA: Insufficient documentation

## 2020-01-01 MED ORDER — GADOBUTROL 1 MMOL/ML IV SOLN
7.0000 mL | Freq: Once | INTRAVENOUS | Status: AC | PRN
  Administered 2020-01-01: 7 mL via INTRAVENOUS

## 2020-01-02 ENCOUNTER — Telehealth: Payer: Self-pay

## 2020-01-02 NOTE — Telephone Encounter (Signed)
-----   Message from Secundino Ginger sent at 01/02/2020  1:29 PM EDT ----- Regarding: [facility] Lrene @ [facility] left VM that she has been trying to obtain records on this patient. Please call [phone] fax # [phone].   I have not received anyhthng from [facility] on this patient requesting records. I wonder if they have the right fax number.

## 2020-01-02 NOTE — Telephone Encounter (Signed)
Fax request information sent to (Fax [phone])

## 2020-01-07 ENCOUNTER — Inpatient Hospital Stay: Payer: Medicare HMO

## 2020-01-07 ENCOUNTER — Inpatient Hospital Stay: Payer: Medicare HMO | Attending: Hematology and Oncology | Admitting: Hematology and Oncology

## 2020-01-07 ENCOUNTER — Other Ambulatory Visit: Payer: Self-pay

## 2020-01-07 VITALS — BP 112/71 | HR 53 | Temp 99.3°F | Resp 19 | Wt 172.0 lb

## 2020-01-07 DIAGNOSIS — L02215 Cutaneous abscess of perineum: Secondary | ICD-10-CM | POA: Diagnosis not present

## 2020-01-07 DIAGNOSIS — C73 Malignant neoplasm of thyroid gland: Secondary | ICD-10-CM | POA: Insufficient documentation

## 2020-01-07 DIAGNOSIS — G893 Neoplasm related pain (acute) (chronic): Secondary | ICD-10-CM | POA: Diagnosis not present

## 2020-01-07 DIAGNOSIS — I4891 Unspecified atrial fibrillation: Secondary | ICD-10-CM | POA: Diagnosis not present

## 2020-01-07 DIAGNOSIS — Z7189 Other specified counseling: Secondary | ICD-10-CM | POA: Diagnosis not present

## 2020-01-07 DIAGNOSIS — C770 Secondary and unspecified malignant neoplasm of lymph nodes of head, face and neck: Secondary | ICD-10-CM | POA: Diagnosis not present

## 2020-01-07 LAB — CBC WITH DIFFERENTIAL/PLATELET
Abs Immature Granulocytes: 0.02 10*3/uL (ref 0.00–0.07)
Basophils Absolute: 0 10*3/uL (ref 0.0–0.1)
Basophils Relative: 1 %
Eosinophils Absolute: 0.2 10*3/uL (ref 0.0–0.5)
Eosinophils Relative: 3 %
HCT: 47.8 % (ref 39.0–52.0)
Hemoglobin: 16.3 g/dL (ref 13.0–17.0)
Immature Granulocytes: 0 %
Lymphocytes Relative: 18 %
Lymphs Abs: 1.2 10*3/uL (ref 0.7–4.0)
MCH: 30.9 pg (ref 26.0–34.0)
MCHC: 34.1 g/dL (ref 30.0–36.0)
MCV: 90.5 fL (ref 80.0–100.0)
Monocytes Absolute: 0.9 10*3/uL (ref 0.1–1.0)
Monocytes Relative: 13 %
Neutro Abs: 4.4 10*3/uL (ref 1.7–7.7)
Neutrophils Relative %: 65 %
Platelets: 300 10*3/uL (ref 150–400)
RBC: 5.28 MIL/uL (ref 4.22–5.81)
RDW: 14.4 % (ref 11.5–15.5)
WBC: 6.7 10*3/uL (ref 4.0–10.5)
nRBC: 0 % (ref 0.0–0.2)

## 2020-01-07 LAB — COMPREHENSIVE METABOLIC PANEL
ALT: 53 U/L — ABNORMAL HIGH (ref 0–44)
AST: 41 U/L (ref 15–41)
Albumin: 4.4 g/dL (ref 3.5–5.0)
Alkaline Phosphatase: 58 U/L (ref 38–126)
Anion gap: 12 (ref 5–15)
BUN: 12 mg/dL (ref 8–23)
CO2: 23 mmol/L (ref 22–32)
Calcium: 8.9 mg/dL (ref 8.9–10.3)
Chloride: 102 mmol/L (ref 98–111)
Creatinine, Ser: 0.78 mg/dL (ref 0.61–1.24)
GFR, Estimated: >60 mL/min (ref >60)
Glucose, Bld: 106 mg/dL — ABNORMAL HIGH (ref 70–99)
Potassium: 4.4 mmol/L (ref 3.5–5.1)
Sodium: 137 mmol/L (ref 135–145)
Total Bilirubin: 1 mg/dL (ref 0.3–1.2)
Total Protein: 7.7 g/dL (ref 6.5–8.1)

## 2020-01-07 NOTE — Progress Notes (Signed)
[facility]  [address], [address] [place], [place] [address] Phone: [phone]  Fax: [phone]   Clinic Day:  01/07/2020  Referring physician: Leonel Ramsay, MD  Chief Complaint: Grant Freeman is a 85 y.o. male with medullary thyroid cancer who is seen for review of interim biopsy, [facility] genetic testing, and discussion regarding direction of therapy.   HPI: The patient was last seen in the medical oncology clinic on 12/02/2019. At that time, he denied any new symptoms. Exam was stable. We discussed plans for a ultrasound guided biopsy and [facility] genetic testing.   Right supraclavicular biopsy on 12/11/2019 confirmed metastatic carcinoma.  IHC for calcitonin was diffuse and strong staining confirming the diagnosis of metastatic medullary carcinoma of the thyroid.  Patient was seen in the [facility] ER on 12/20/2019 for a peri-rectal abscess.  Patient stated that he had a biopsy on 11/06/2019 in the general surgeon's office.  Patient stated that the surgeon "missed the whole thing".  He stated that he can still feel a boil and wanted it lanced in the emergency department. The area was intermittently painful.  He denied any bleeding or drainage; he denied fever. Exam revealed a 1 cm round, firm umbilicated lesion at 4 o'clock position in the peri-rectal area.  Lesion was non-fluctuant and non-erythematous.  Follow up with general surgery was recommended.   He saw Dr Christian Mate on 12/24/2019.  He noted anal rectal pain, soilage and concern about a raining boil.  Biopsy in 10/2019 confirmed only inflammatory fibrous changes he had excessive pruritus in this area and was utilizing alcohol wipes exam revealed some excoriation/evidence of chronic dermal wetness in the perianal region.  There was a very prominent indurated sentinel tag at 12:00 seem to be very external fissure.  At 1:00, there appeared to be a nearly healed external os of the superficial fistula  extends within the anal cana.  Recommendations included an MRI of the anal rectal area, topical nifedipine to treat any spasm, fiber supplementation, and use of Tucks pads with witch hazel rather than alcohol topically.  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  Variants of uncertain significance included: ALK c.1184G>A (p.Arg395His) and NF1 c.5288A>T (p.Tyr1763Phe).  During the interim, he "could be better".  He states that he has 2 problems. The first problem is that he has been unable to receive vandetanib. His other problem is a perianal boil/cyst and he cannot find a physician who will "cut it out." He tried to cut it out himself yesterday with small scissors by piercing a hole in the boil. He states that it bled a little bit and now the area feels better.  He denies any pain related to his cancer. He states that when he had his thyroidectomy, it caused the cancer to spread throughout his body. He states that blood root was the only thing that could "knock it all out," so he is considering starting to take it again.  He reports leg cramps. He did not sleep well last night. He has an "infection" on his left ankle and has the area wrapped. He was given antibiotics which helped. He denies dizziness, lightheadedness, shortness of breath, chest pain, orthopnea, leg swelling. He has been on Synthroid for 10-20 years; the dose has not changed.  I discussed his atrial fibrillation. The patient states that he has atrial fibrillation  because he is not eating or drinking properly. He states that he has drinking too much alcohol over the past few weeks. He drinks 6-8 oz of red wine per week, which he states is "too much".  We discussed the need for cardiology clearance before starting vendetanib.  He agrees to see Dr. Ubaldo Glassing on  01/09/2020.   Past Medical History:  Diagnosis Date  . Thyroid cancer, medullary carcinoma [facility])     Past Surgical History:  Procedure Laterality Date  . THYROIDECTOMY      No family history on file.  Social History:  reports that he has never smoked. He has quit using smokeless tobacco. He reports current alcohol use of about 2.0 standard drinks of alcohol per week. He reports that he does not use drugs. The patient drinks beer or wine a couple times per weeks. Denies tobacco or drug use. He still lives in [place]. He is a retired Clinical biochemist. The patient is alone today.  Allergies:  Allergies  Allergen Reactions  . Penicillins Hives    Current Medications: Current Outpatient Medications  Medication Sig Dispense Refill  . levothyroxine (SYNTHROID, LEVOTHROID) 112 MCG tablet Take 1 tablet by mouth daily.    Marland Kitchen lidocaine (XYLOCAINE) 2 % jelly Apply 1 application topically as needed. 30 mL 0   No current facility-administered medications for this visit.  Review of Systems  Constitutional: Negative for chills, diaphoresis, fever, malaise/fatigue and weight loss (stable).       "Could be better."  HENT: Negative for congestion, ear discharge, ear pain, hearing loss, nosebleeds, sinus pain, sore throat and tinnitus.        Hoarse voice.  Eyes: Negative for blurred vision.  Respiratory: Negative for cough, hemoptysis, sputum production and shortness of breath.   Cardiovascular: Negative for chest pain, palpitations and leg swelling.  Gastrointestinal: Negative for abdominal pain, blood in stool, constipation, diarrhea, heartburn, melena, nausea and vomiting.  Genitourinary: Negative for dysuria, frequency, hematuria and urgency.  Musculoskeletal: Positive for myalgias (leg cramps). Negative for back pain, joint pain and neck pain.  Skin: Negative for itching and rash.       Left ankle wrapped because of an infection  Neurological: Negative for dizziness, tingling, sensory  change, weakness and headaches.  Endo/Heme/Allergies: Does not bruise/bleed easily.  Psychiatric/Behavioral: Negative for depression and memory loss. The patient has insomnia (last night). The patient is not nervous/anxious.   All other systems reviewed and are negative.  Performance status (ECOG): 1  Vitals Blood pressure 112/71, pulse (!) 53, temperature 99.3 F (37.4 C), temperature source Tympanic, resp. rate 19, weight 171 lb 15.3 oz (78 kg), SpO2 96 %.   Physical Exam Vitals and nursing note reviewed.  Constitutional:      General: He is not in acute distress.    Appearance: He is not diaphoretic.  HENT:     Head: Normocephalic and atraumatic.     Mouth/Throat:     Mouth: Mucous membranes are moist.     Pharynx: Oropharynx is clear.     Comments: Hoarse voice. Eyes:     General: No scleral icterus.    Extraocular Movements: Extraocular movements intact.     Conjunctiva/sclera: Conjunctivae normal.     Pupils: Pupils are equal, round, and reactive to light.  Neck:     Comments: Scarring around neck. Cardiovascular:     Rate and Rhythm: Normal rate. Rhythm irregular.     Heart sounds: Normal heart sounds. No murmur heard.   Pulmonary:  Effort: Pulmonary effort is normal. No respiratory distress.     Breath sounds: Normal breath sounds. No wheezing or rales.  Chest:     Chest wall: No tenderness.  Abdominal:     General: Bowel sounds are normal. There is no distension.     Palpations: Abdomen is soft. There is no mass.     Tenderness: There is no abdominal tenderness. There is no guarding or rebound.  Musculoskeletal:        General: No swelling or tenderness. Normal range of motion.     Cervical back: Normal range of motion and neck supple.  Lymphadenopathy:     Head:     Right side of head: No preauricular, posterior auricular or occipital adenopathy.     Left side of head: No preauricular, posterior auricular or occipital adenopathy.     Cervical: No cervical  adenopathy.     Upper Body:     Right upper body: No supraclavicular or axillary adenopathy.     Left upper body: No supraclavicular or axillary adenopathy.     Lower Body: No right inguinal adenopathy. No left inguinal adenopathy.  Skin:    General: Skin is warm and dry.     Comments: Scarring on arms.  Left ankle is wrapped.  Neurological:     Mental Status: He is alert and oriented to person, place, and time.  Psychiatric:        Behavior: Behavior normal.        Thought Content: Thought content normal.        Judgment: Judgment normal.    No visits with results within 3 Day(s) from this visit.  Latest known visit with results is:  Hospital Outpatient Visit on 12/11/2019  Component Date Value Ref Range Status  . SURGICAL PATHOLOGY 12/11/2019    Final-Edited                   Value:SURGICAL PATHOLOGY THIS IS AN ADDENDUM REPORT CASE: [id number] PATIENT: [name] Surgical Pathology Report Addendum  Reason for Addendum #1:  Immunohistochemistry results  Specimen Submitted: A. Lymph node, right supraclavicular; biopsy  Clinical History: Medullary carcinoma of the thyroid with recurrence. Previous thyroidectomy at [facility] in 2002 with positive lymph nodes, and resection of nodal metastases at [facility] in 2006 and 2012. Post chemotherapy and kinase inhibitor therapy.  DIAGNOSIS: A. LYMPH NODE, RIGHT SUPRACLAVICULAR; ULTRASOUND-GUIDED CORE BIOPSY: - METASTATIC CARCINOMA, SEE COMMENT.  Comment: A few very tiny deposits of metastatic carcinoma are present, confirmed by cytokeratin immunohistochemistry (IHC) (AE1/AE3/PCK26). The tumor cells show nested growth, minimal pleomorphism, and absent mitotic figures, morphologically compatible with metastatic medullary carcinoma of the thyroid.  Calcitonin IHC has b                         een ordered, and the results will be resulted in an addendum.  Cytokeratin IHC slides were prepared by Launa Grill, [place]. All controls  stained appropriately.  This test was developed and its performance characteristics determined by [facility]. It has not been cleared or approved by the [place] Food and Drug Administration. The FDA does not require this test to go through premarket FDA review. This test is used for clinical purposes. It should not be regarded as investigational or for research. This laboratory is certified under the Clinical Laboratory Improvement Amendments (CLIA) as qualified to perform high complexity clinical laboratory testing.  GROSS DESCRIPTION: A. Labeled: Right supraclavicular node Received: Formalin Number of needle core  biopsy(s): 3 cores with multiple additional fragments Length: Range from 0.7 to 0.9 cm Diameter: 0.1 cm Description: Received are cores and fragments of tan-gray soft tissue. The fragments are 0.7 x 0.5 x 0.1 cm in aggregat                         e. Ink: None Entirely submitted in cassettes 1-4 with 1 core per cassettes 1-3 and the remaining fragments in cassette 4.  Final Diagnosis performed by Bryan Lemma, MD.   Electronically signed 12/16/2019 12:42:13PM The electronic signature indicates that the named Attending Pathologist has evaluated the specimen Technical component performed at [facility], [address], [place], [place] [address] Lab: [phone] Dir: Rush Farmer, MD, MMM  Professional component performed at [facility], [facility], [place], [place], [place] [address] Lab: [phone] Dir: Dellia Nims. Reuel Derby, MD  ADDENDUM: IHC for calcitonin was performed. The tumor cells are positive for calcitonin with diffuse and strong staining. The result confirms the diagnosis of metastatic medullary carcinoma of the thyroid.  IHC slides were prepared by [place] [facility] [employer], [place], [place] All controls stained appropriately.  This test was develope                         d and its performance characteristics  determined by [facility]. It has not been cleared or approved by the [place] Food and Drug Administration. The FDA does not require this test to go through premarket FDA review. This test is used for clinical purposes. It should not be regarded as investigational or for research. This laboratory is certified under the Clinical Laboratory Improvement Amendments (CLIA) as qualified to perform high complexity clinical laboratory testing.  Addendum #1 performed by Bryan Lemma, MD.   Electronically signed 12/20/2019 12:01:27PM The electronic signature indicates that the named Attending Pathologist has evaluated the specimen Technical component performed at [facility], [address] [place], [place] [address] Lab: [phone] Dir: Rush Farmer, MD, MMM  Professional component performed at [facility], [facility], [place], [place], [place] [address] Lab: [phone] Dir: Dellia Nims. Rubinas, MD    Assessment:  JKWON TREPTOW is a 85 y.o. male with stage IV medullary thyroid cancer s/p medullary thyroid cancer s/p bilateral thyroidectomy 07/06/2000.  Pathology revealed a 5.2 cm medullary carcinoma. Cancer extended up to but not through the thyroid capsule. 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 Right sentinel lymph node was negative. Left central neck lymph node was positive with a small focus of metastatic medullary carcinoma. Two lymph nodes in the left upper mediastinum were negative for cancer and a third paratracheal lymph node was negative.  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  Variants of uncertain significance included: ALK c.1184G>A (p.Arg395His) and NF1 c.5288A>T (p.Tyr1763Phe).  He received an unknown number cycles of carboplatin and Taxol beginning in 11/2001.   He developed recurrent disease in 2006. He underwent right neck dissection on 10/04/2004.  There was a 4.2 cm soft tissue mass of recurrent medullary thyroid cancer. Four of 4 right-sided neck nodes were positive.  The largest node was 4.2 cm with extracapsular extension into the soft tissue.  A right neck level IIB dissection revealed 8 of 11 lymph nodes positive for metastatic thyroid cancer. The largest focus was  5 mm with tumor present in perinodal lymphatics.  He received DTIC (dacarbazine) with some improvement in serum markers in 2010.  PET scan on 03/11/2010 revealed increased FDG uptake in the left base of neck evidence of distant metastasis.  Notes indicate additional neck dissection in 2012 for recurrent disease.  He received vandetanib from 05/2009 through 05/17/2010. Treatment was interrupted in 12/2009 secondary to an infection in his arm. Dose was reduced from 300 mg a day to 02/2010 at 200 mg a day in 02/2010. He believes his tumor marker dramatically improved with therapy.  He received no TKI therapy between 2012 in 2014 secondary to patient refusal. Calcitonin remained in the 875 range in 03/2012.  In 03/2012, he began ingesting and applying "blood root" to his neck.  PET scan on 10/18/2012 revealed an increase in a 3.19m nodule to 594m but no visible disease in neck, mediastinum or abdomen.  Recommendation was for no vandetanib.  PET scan on 04/01/2016 was suboptimal secondary to altered biomechanics after recent food ingestion prior to the scan. There was new retrotracheal, paraesophageal soft tissue nodule without increased metabolic activity (may falsely lower SUV secondary to altered biomechanics).There was unchanged subcentimeter pulmonary nodule in the right upper lobe which was below the size threshold for PET imaging.  Neck soft tissue CT scan on 04/01/2016 revealed new 1.2 x 0.9 cm retrotracheal, paraesophageal enhancing soft tissue nodule suspicious for metastatic  disease, but does not appear to be associated with definite increased metabolic activity.  There was redemonstration of a 1.6 x 0.9 cm enhancing soft tissue nodules in the right thyroid bed and adjacent to the cricothyroid cartilage.  There was no significant interval change in size of right 6 mm apical pulmonary nodule.  [facility] 09/30/2016 revealed redemonstrated minimally FDG avid retrotracheal, paraesophageal soft tissue with limited comparison to prior study given previous muscle uptake. This remained concerning for possible metastatic disease. There was unchanged CT appearance of enhancing cricothyroid soft tissue also with mild FDG avidity, likely ectopic thyroid tissue or level VI lymph nodes;  metastatic disease was not excluded. There was no evidence of new FDG avid disease.  Diagnostic neck CT on 09/30/2016 revealed stable appearance of right retrotracheal, paraesophageal enhancing soft tissue nodule (1.3 x 0.9 cm compared to 1.2 x 0.9 cm). This lesion was suspicious for metastatic disease. Attention on follow-up.  There was redemonstration of extensive enhancing soft tissue nodules in the right thyroid bed and adjacent the cricothyroid cartilage which were unchanged in size and may represent residual or regenerated thyroid tissue.  There was opacification of the maxillary, ethmoid and frontal sinuses which may be seen in the setting of sinusitis.  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   PET scan on 11/21/2019 noted diffuse low level hypermetabolism seen in the left thyroid cartilage region including asymmetric soft tissue just superficial to the cartilage. This may reflect the FDG avid "cricopharyngeal soft tissue" described in the outside report from 2018. Metastatic disease remained a concern. There was a 1.9 cm right retrotracheal soft tissue nodule in the region of the thoracic inlet shows low level hypermetabolism, concerning for metastatic disease. There was a 9 mm  anterior right upper lobe pulmonary nodule increased from 4 mm on the study 9 years ago.  There was no hypermetabolism. Relatively slow growth would suggest a benign etiology, but follow-up may be warranted to exclude low-grade, poorly FDG avid neoplasm. There was a 5 mm right lower lobe pulmonary nodule new since  2012. Continue attention on follow-up recommended. There was hepatic steatosis.  Soft tissue neck CT on 11/22/2019 revealed a 2 cm mass at the right tracheoesophageal groove new from PET scan in 2012, presumed local recurrence. A 7 x 17 mm nodule anterior to the upper cricoid cartilage was also worrisome, but not hypermetabolic. There was a 11 mm lymph node at the junction of the right supraclavicular fossa and IJ chain, larger than in 2012 and suspicious.  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 There was a right upper lobe pulmonary nodule, reference preceding PET-CT.  Right supraclavicular biopsy on 12/11/2019 confirmed metastatic carcinoma.  IHC for calcitonin was diffuse and strong staining confirming the diagnosis of metastatic medullary carcinoma of the thyroid.  Calcitonin has been followed:  2,721 on 05/24/2010, 706 on 08/11/2010, 714 on 10/06/2010, 768 on 11/26/2010, 1,013 on 07/14/2011, 893 on 04/03/2012, 956 on 06/04/2012, 653 on 10/01/2012, 967 on 10/18/2012, 601 on 12/03/2012, 753 and 11/05/2013, 1,121 on 02/06/2014, 833 on 05/06/2014, 1321 on 08/19/2014, 1171 on 03/10/2016, 1411 on 12/22/2015, 1,254 on 04/06/2016, 1260 on 09/13/2016, 2000 on 04/20/2018, 4831 on 10/16/2019, and 4975.0 on 11/25/2019.  CEA has been followed:  43 and 09/16/2009, 40.8 on 10/05/2009, 38.5 on 11/09/2009, 46.6 on 12/16/2009, 36.9 on 01/27/2010, 40.1 on 02/23/2010, 42.1 on 03/25/2010, 47.9 on 04/20/2010, 44.6 on 05/24/2010, 22.2 on 08/11/2010, 15.2 on 10/06/2010, 18.4 on 11/26/2010, 19.4 on 07/14/2011, 23 on 04/03/2012, 22.6 on 10/01/2012, 35.4 on 12/22/2015, 43.9 on 04/06/2016, 43.5 on 09/13/2016, 69.4  on 04/20/2018, 73.7 on 10/16/2019, and 77.1 on 11/25/2019.  EKG on 12/04/2019 revealed atrial fibrillation with a QT of 388 (QTc 453).  The patient does not plan to receive the COVID-19 vaccine.  Symptomatically, he has issues with a perirectal lesion.  He has atrial fibrillation.  Plan: 1.   Labs today:  CBC with diff, CMP, TSH, free T4..  2.   Medullary thyroid carcinoma             Clinically, he denies any new symptoms.             His clinical course has been indolent.             PET scan on 11/21/2019 revealed progressive disease.  Right supraclavicular biopsy confirmed metastatic medullary carcinoma.  Calcitonin and CEA have increased over time.  Previously, he received vandetinib; vandetinib has a RR of about 37% in clinical trials.              Review potential side effects of increased QT, skin toxicity, diarrhea, CHF, hypertension, hemorrhage, CVA, ILD, hypocalcemia.             Calcitonin and CEA have increased over time.             Patient is not a surgical candidate.  Discuss issues regarding atrial fibrillation and need for cardiology clearance prior to vandetinib.   Patient agrees to cardiology referral.  Discuss importance of discontinuation of alcohol. 3.   Atrial fibrillation  Etiology unclear.  He has been asymptomatic.  Free T4 is normal.  Patient has appt with Dr Ubaldo Glassing Thursday at 9:30 AM.     Please provide patient with instructions how to get there. 4.   Perirectal lesion  Encourage follow-up with surgery.  Encourage patient NOT to try to resolve the issues himself (scissors, etc). 5.   RTC in 2 weeks for MD assessment.  I discussed the assessment and treatment plan with the patient.  The patient was provided an opportunity  to ask questions and all were answered.  The patient agreed with the plan and demonstrated an understanding of the instructions.  The patient was advised to call back if the symptoms worsen or if the condition fails to improve as  anticipated.  I provided 34 minutes of face-to-face time during this encounter and > 50% was spent counseling as documented under my assessment and plan.  An additional 10 minutes were spent reviewing his chart (Epic and Care Everywhere) including notes, labs, and imaging studies.    Hawley Pavia C. Mike Gip, MD, PhD    01/07/2020, 2:21 PM  I, Mirian Mo Tufford, am acting as Education administrator for [employer]. Mike Gip, MD, PhD.  I, Aashvi Rezabek C. Mike Gip, MD, have reviewed the above documentation for accuracy and completeness, and I agree with the above.

## 2020-01-08 ENCOUNTER — Telehealth: Payer: Self-pay

## 2020-01-08 LAB — T4, FREE: Free T4: 0.85 ng/dL (ref 0.61–1.12)

## 2020-01-08 LAB — TSH: TSH: 0.016 u[IU]/mL — ABNORMAL LOW (ref 0.350–4.500)

## 2020-01-08 NOTE — Telephone Encounter (Signed)
Labs has been routed to Dr Ola Spurr office.

## 2020-01-08 NOTE — Telephone Encounter (Signed)
01/07/20 lab results routed to Dr Ola Spurr

## 2020-01-16 ENCOUNTER — Telehealth: Payer: Self-pay | Admitting: Infectious Diseases

## 2020-01-16 NOTE — Telephone Encounter (Signed)
Hi Dr Christian Mate I am seeing this patient today in clinic.  As you know he is a little hard to get a history from.  He still in a lot of pain from his rectal issues .  I did see the MRI done last week.  Do you know if he has follow-up with you are if there are any plans for surgery? Thanks Grant Freeman

## 2020-01-22 ENCOUNTER — Inpatient Hospital Stay: Payer: Medicare HMO | Admitting: Hematology and Oncology

## 2020-01-22 DIAGNOSIS — I4891 Unspecified atrial fibrillation: Secondary | ICD-10-CM | POA: Insufficient documentation

## 2020-01-31 DIAGNOSIS — Z7189 Other specified counseling: Secondary | ICD-10-CM | POA: Insufficient documentation

## 2020-02-04 ENCOUNTER — Ambulatory Visit: Payer: Medicare HMO | Admitting: Surgery

## 2020-02-17 ENCOUNTER — Telehealth: Payer: Self-pay | Admitting: Hematology and Oncology

## 2020-02-17 NOTE — Telephone Encounter (Signed)
I have attempted to call patient several times to reschedule his NO Show appt. I have left voice messages and he has not returning my call as of yet.

## 2020-03-25 ENCOUNTER — Telehealth: Payer: Self-pay | Admitting: Hematology and Oncology

## 2020-03-25 NOTE — Telephone Encounter (Signed)
Left voice mail to inform patient about his scheduled appt. for 03/31/20 which is first availability.

## 2020-03-26 ENCOUNTER — Telehealth: Payer: Self-pay

## 2020-03-31 ENCOUNTER — Ambulatory Visit: Payer: Medicare HMO | Admitting: Hematology and Oncology

## 2020-04-01 ENCOUNTER — Telehealth: Payer: Self-pay

## 2020-04-01 NOTE — Progress Notes (Signed)
[facility]  [address], [address] [place], [place] [address] Phone: [phone]  Fax: [phone]   Clinic Day:  04/02/2020  Referring physician: Leonel Ramsay, MD  Chief Complaint: Grant Freeman is a 86 y.o. male with medullary thyroid cancer who is seen for 3 month assessment.  HPI: The patient was last seen in the medical oncology clinic on 01/07/2020. At that time, he has issues with a perirectal lesion. He had atrial fibrillation. Hematocrit was 47.8, hemoglobin 16.3, platelets 300,000, WBC 6,700. ALT was 53. TSH was 0.016 with a free T4 of 0.85. He was to follow up in 2 weeks for assessment and review of work-up.  He was lost to follow-up.  The patient saw Doristine Mango, [place] on 01/09/2020 for new onset atrial fibrillation. He was prescribed metoprolol succinate 25 mg and Eliquis 5 mg BID. A Holter monitor was placed. Follow-up was planned for 6 weeks.  The patient saw Dr. Lysle Pearl on 01/21/2020 for his anal abscess vs fistula tract. Discussed EUA, possible seton placement, and possible tag removal; patient agreed to proceed.  Echocardiogram on 01/31/2020 revealed an EF of >55%.  The patient saw Dr. Ubaldo Glassing on 02/10/2020 for pre-op clearance. He reported that he had not taken Eliquis. He was taking metoprolol.  Holter monitor had not been returned.  Echo revealed normal LV function with mild MR and TR.  He was cleared for surgery.  Follow-up was planned for 3 months.  He was seen by Dr Daune Perch in the [facility] endocrinology clinic in [place] on 03/17/2020.  He felt well.  He denied any swallowing difficulties or shortness of breath.  He continue to treat himself at home with a mixture of maple syrup at table baking soda 3-4 times a week.  He was noted to have discontinued his Eliquis secondary to concerns that it might interfere with anal fistula repair.  He was also no longer taking his metoprolol.  He had not missed any Synthroid.  He was felt to  need assistance navigating all of his various health care needs.  He was set up with the patient navigator to assist him in coordinating follow-up his thyroid cancer as well as his anal fistula repair.  Thyroid function tests were ordered with plans to likely reduce his Synthroid given his TSH of 0.025 and free T4 of 1.25 in 10/16/2019.  TSH was 0.03 free T4 of 0.90 on 03/17/2020.  CEA was 86.4.  Calcitonin was 4100.  During the interim, he has been fine. The patient went to see an oral surgeon and had 3 teeth pulled. He also had a bone spur taken out.  The patient took Metoprolol and Eliquis for a couple of days and then stopped. Per patient, his surgery is next week with Dr. Lysle Pearl. He is having internal itching around the anus. He has never had a colonoscopy.  He is still eating maple syrup and baking soda. He has not had any alcohol in the past 3-4 weeks. He was drinking beer and red wine. In a weekend, he would drink a bottle of wine and a 6 pack of beer.   Past Medical History:  Diagnosis Date  . Thyroid cancer, medullary carcinoma [facility])     Past Surgical History:  Procedure Laterality Date  . THYROIDECTOMY      History reviewed. No pertinent family history.  Social History:  reports that he has never smoked. He has quit using smokeless tobacco. He reports current alcohol use of about  2.0 standard drinks of alcohol per week. He reports that he does not use drugs. The patient has not had any alcohol in 3-4 weeks. When he was drinking, he drank 1 bottle of wine and a 6 pack of beer in a weekend. Denies tobacco or drug use. He still lives in [place]. He is a retired Clinical biochemist. The patient is alone today.  Allergies:  Allergies  Allergen Reactions  . Penicillins Hives    Current Medications: Current Outpatient Medications  Medication Sig Dispense Refill  . levothyroxine (SYNTHROID, LEVOTHROID) 112 MCG tablet Take 1 tablet by mouth daily.    Marland Kitchen lidocaine (XYLOCAINE) 2 % jelly  Apply 1 application topically as needed. (Patient not taking: Reported on 04/02/2020) 30 mL 0   No current facility-administered medications for this visit.  Review of Systems  Constitutional: Negative for chills, diaphoresis, fever, malaise/fatigue and weight loss (stable).  HENT: Negative for congestion, ear discharge, ear pain, hearing loss, nosebleeds, sinus pain, sore throat and tinnitus.        Hoarse voice. Recent oral surgery.  Eyes: Negative for blurred vision.  Respiratory: Negative for cough, hemoptysis, sputum production and shortness of breath.   Cardiovascular: Negative for chest pain, palpitations and leg swelling.  Gastrointestinal: Negative for abdominal pain, blood in stool, constipation, diarrhea, heartburn, melena, nausea and vomiting.  Genitourinary: Negative for dysuria, frequency, hematuria and urgency.  Musculoskeletal: Positive for myalgias (leg cramps). Negative for back pain, joint pain and neck pain.  Skin: Positive for itching (anus). Negative for rash.  Neurological: Negative for dizziness, tingling, sensory change, weakness and headaches.  Endo/Heme/Allergies: Does not bruise/bleed easily.  Psychiatric/Behavioral: Negative for depression and memory loss. The patient has insomnia (last night). The patient is not nervous/anxious.   All other systems reviewed and are negative.  Performance status (ECOG): 1  Vitals Blood pressure 117/82, pulse 91, temperature 97.9 F (36.6 C), temperature source Tympanic, resp. rate 18, weight 171 lb 8.3 oz (77.8 kg), SpO2 95 %.   Physical Exam Vitals and nursing note reviewed.  Constitutional:      General: He is not in acute distress.    Appearance: He is not diaphoretic.  HENT:     Head: Normocephalic and atraumatic.     Mouth/Throat:     Mouth: Mucous membranes are moist.     Pharynx: Oropharynx is clear.     Comments: Hoarse voice. Eyes:     General: No scleral icterus.    Extraocular Movements: Extraocular  movements intact.     Conjunctiva/sclera: Conjunctivae normal.     Pupils: Pupils are equal, round, and reactive to light.  Cardiovascular:     Rate and Rhythm: Normal rate. Rhythm irregular.     Heart sounds: Normal heart sounds. No murmur heard.   Pulmonary:     Effort: Pulmonary effort is normal. No respiratory distress.     Breath sounds: Normal breath sounds. No wheezing or rales.  Chest:     Chest wall: No tenderness.  Breasts:     Right: No axillary adenopathy or supraclavicular adenopathy.     Left: No axillary adenopathy or supraclavicular adenopathy.    Abdominal:     General: Bowel sounds are normal. There is no distension.     Palpations: Abdomen is soft. There is no mass.     Tenderness: There is no abdominal tenderness. There is no guarding or rebound.  Musculoskeletal:        General: No swelling or tenderness. Normal range of motion.  Cervical back: Normal range of motion and neck supple.  Lymphadenopathy:     Head:     Right side of head: No preauricular, posterior auricular or occipital adenopathy.     Left side of head: No preauricular, posterior auricular or occipital adenopathy.     Cervical: No cervical adenopathy.     Upper Body:     Right upper body: No supraclavicular or axillary adenopathy.     Left upper body: No supraclavicular or axillary adenopathy.     Lower Body: No right inguinal adenopathy. No left inguinal adenopathy.  Skin:    General: Skin is warm and dry.     Comments: Multiple areas of scarring (neck and arms).  Neurological:     Mental Status: He is alert and oriented to person, place, and time.  Psychiatric:        Behavior: Behavior normal.        Thought Content: Thought content normal.        Judgment: Judgment normal.    No visits with results within 3 Day(s) from this visit.  Latest known visit with results is:  Office Visit on 01/07/2020  Component Date Value Ref Range Status  . Free T4 01/07/2020 0.85  0.61 - 1.12  ng/dL Final   Comment: (NOTE) Biotin ingestion may interfere with free T4 tests. If the results are inconsistent with the TSH level, previous test results, or the clinical presentation, then consider biotin interference. If needed, order repeat testing after stopping biotin. Performed at [facility], [address], [place], [place] [address]   . TSH 01/07/2020 0.016* 0.350 - 4.500 uIU/mL Final   Comment: Performed by a 3rd Generation assay with a functional sensitivity of <=0.01 uIU/mL. Performed at [facility], [address] [place], [place] [address]   . Sodium 01/07/2020 137  135 - 145 mmol/L Final  . Potassium 01/07/2020 4.4  3.5 - 5.1 mmol/L Final  . Chloride 01/07/2020 102  98 - 111 mmol/L Final  . CO2 01/07/2020 23  22 - 32 mmol/L Final  . Glucose, Bld 01/07/2020 106* 70 - 99 mg/dL Final   Glucose reference range applies only to samples taken after fasting for at least 8 hours.  . BUN 01/07/2020 12  8 - 23 mg/dL Final  . Creatinine, Ser 01/07/2020 0.78  0.61 - 1.24 mg/dL Final  . Calcium 01/07/2020 8.9  8.9 - 10.3 mg/dL Final  . Total Protein 01/07/2020 7.7  6.5 - 8.1 g/dL Final  . Albumin 01/07/2020 4.4  3.5 - 5.0 g/dL Final  . AST 01/07/2020 41  15 - 41 U/L Final  . ALT 01/07/2020 53* 0 - 44 U/L Final  . Alkaline Phosphatase 01/07/2020 58  38 - 126 U/L Final  . Total Bilirubin 01/07/2020 1.0  0.3 - 1.2 mg/dL Final  . GFR, Estimated 01/07/2020 >60  >60 mL/min Final   Comment: (NOTE) Calculated using the CKD-EPI Creatinine Equation (2021)   . Anion gap 01/07/2020 12  5 - 15 Final   Performed at [facility], [address], [place], [place] [address]  . WBC 01/07/2020 6.7  4.0 - 10.5 K/uL Final  . RBC 01/07/2020 5.28  4.22 - 5.81 MIL/uL Final  . Hemoglobin 01/07/2020 16.3  13.0 - 17.0 g/dL Final  . HCT 01/07/2020 47.8  39.0 - 52.0 % Final  . MCV 01/07/2020 90.5  80.0 - 100.0 fL Final  . MCH 01/07/2020 30.9  26.0 - 34.0 pg Final  .  MCHC 01/07/2020 34.1  30.0 - 36.0 g/dL Final  . RDW 01/07/2020 14.4  11.5 - 15.5 % Final  . Platelets 01/07/2020 300  150 - 400 K/uL Final  . nRBC 01/07/2020 0.0  0.0 - 0.2 % Final  . Neutrophils Relative % 01/07/2020 65  % Final  . Neutro Abs 01/07/2020 4.4  1.7 - 7.7 K/uL Final  . Lymphocytes Relative 01/07/2020 18  % Final  . Lymphs Abs 01/07/2020 1.2  0.7 - 4.0 K/uL Final  . Monocytes Relative 01/07/2020 13  % Final  . Monocytes Absolute 01/07/2020 0.9  0.1 - 1.0 K/uL Final  . Eosinophils Relative 01/07/2020 3  % Final  . Eosinophils Absolute 01/07/2020 0.2  0.0 - 0.5 K/uL Final  . Basophils Relative 01/07/2020 1  % Final  . Basophils Absolute 01/07/2020 0.0  0.0 - 0.1 K/uL Final  . Immature Granulocytes 01/07/2020 0  % Final  . Abs Immature Granulocytes 01/07/2020 0.02  0.00 - 0.07 K/uL Final   Performed at [facility], [address] [place], [place] [address]    Assessment:  TYQUEZ HOLLIBAUGH is a 86 y.o. male with stage IV medullary thyroid cancer s/p medullary thyroid cancer s/p bilateral thyroidectomy 07/06/2000.  Pathology revealed a 5.2 cm medullary carcinoma. Cancer extended up to but not through the thyroid capsule. 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 Right sentinel lymph node was negative. Left central neck lymph node was positive with a small focus of metastatic medullary carcinoma. Two lymph nodes in the left upper mediastinum were negative for cancer and a third paratracheal lymph node was negative.  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  Variants of uncertain significance included: ALK c.1184G>A (p.Arg395His) and NF1 c.5288A>T (p.[id number]).  He received an unknown number cycles of carboplatin and Taxol beginning in 11/2001.  He developed  recurrent disease in 2006. He underwent right neck dissection on 10/04/2004.  There was a 4.2 cm soft tissue mass of recurrent medullary thyroid cancer. Four of 4 right-sided neck nodes were positive.  The largest node was 4.2 cm with extracapsular extension into the soft tissue.  A right neck level IIB dissection revealed 8 of 11 lymph nodes positive for metastatic thyroid cancer. The largest focus was 5 mm with tumor present in perinodal lymphatics.  He received DTIC (dacarbazine) with some improvement in serum markers in 2010.  PET scan on 03/11/2010 revealed increased FDG uptake in the left base of neck evidence of distant metastasis.  Notes indicate additional neck dissection in 2012 for recurrent disease.  He received vandetanib from 05/2009 through 05/17/2010. Treatment was interrupted in 12/2009 secondary to an infection in his arm. Dose was reduced from 300 mg a day to 02/2010 at 200 mg a day in 02/2010. He believes his tumor marker dramatically improved with therapy.  He received no TKI therapy between 2012 in 2014 secondary to patient refusal. Calcitonin remained in the 875 range in 03/2012.  In 03/2012, he began ingesting and applying "blood root" to his neck.  PET scan on 10/18/2012 revealed an increase in a 3.33m nodule to 592m but no visible disease in neck, mediastinum or abdomen.  Recommendation was for no vandetanib.  PET scan on 04/01/2016 was suboptimal secondary to altered biomechanics after recent food ingestion prior to the scan. There was new retrotracheal, paraesophageal soft tissue  nodule without increased metabolic activity (may falsely lower SUV secondary to altered biomechanics).There was unchanged subcentimeter pulmonary nodule in the right upper lobe which was below the size threshold for PET imaging.  Neck soft tissue CT scan on 04/01/2016 revealed new 1.2 x 0.9 cm retrotracheal, paraesophageal enhancing soft tissue nodule suspicious for metastatic disease, but does  not appear to be associated with definite increased metabolic activity.  There was redemonstration of a 1.6 x 0.9 cm enhancing soft tissue nodules in the right thyroid bed and adjacent to the cricothyroid cartilage.  There was no significant interval change in size of right 6 mm apical pulmonary nodule.  PET scan at [facility] on 09/30/2016 revealed redemonstrated minimally FDG avid retrotracheal, paraesophageal soft tissue with limited comparison to prior study given previous muscle uptake. This remained concerning for possible metastatic disease. There was unchanged CT appearance of enhancing cricothyroid soft tissue also with mild FDG avidity, likely ectopic thyroid tissue or level VI lymph nodes;  metastatic disease was not excluded. There was no evidence of new FDG avid disease.  Diagnostic neck CT on 09/30/2016 revealed stable appearance of right retrotracheal, paraesophageal enhancing soft tissue nodule (1.3 x 0.9 cm compared to 1.2 x 0.9 cm). This lesion was suspicious for metastatic disease. Attention on follow-up.  There was redemonstration of extensive enhancing soft tissue nodules in the right thyroid bed and adjacent the cricothyroid cartilage which were unchanged in size and may represent residual or regenerated thyroid tissue.  There was opacification of the maxillary, ethmoid and frontal sinuses which may be seen in the setting of sinusitis.  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   PET scan on 11/21/2019 noted diffuse low level hypermetabolism seen in the left thyroid cartilage region including asymmetric soft tissue just superficial to the cartilage. This may reflect the FDG avid "cricopharyngeal soft tissue" described in the outside report from 2018. Metastatic disease remained a concern. There was a 1.9 cm right retrotracheal soft tissue nodule in the region of the thoracic inlet shows low level hypermetabolism, concerning for metastatic disease. There was a 9 mm anterior right upper  lobe pulmonary nodule increased from 4 mm on the study 9 years ago.  There was no hypermetabolism. Relatively slow growth would suggest a benign etiology, but follow-up may be warranted to exclude low-grade, poorly FDG avid neoplasm. There was a 5 mm right lower lobe pulmonary nodule new since 2012. Continue attention on follow-up recommended. There was hepatic steatosis.  Soft tissue neck CT on 11/22/2019 revealed a 2 cm mass at the right tracheoesophageal groove new from PET scan in 2012, presumed local recurrence. A 7 x 17 mm nodule anterior to the upper cricoid cartilage was also worrisome, but not hypermetabolic. There was a 11 mm lymph node at the junction of the right supraclavicular fossa and IJ chain, larger than in 2012 and suspicious.  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 There was a right upper lobe pulmonary nodule, reference preceding PET-CT.  Right supraclavicular biopsy on 12/11/2019 confirmed metastatic carcinoma.  IHC for calcitonin was diffuse and strong staining confirming the diagnosis of metastatic medullary carcinoma of the thyroid.  Calcitonin has been followed:  2,721 on 05/24/2010, 706 on 08/11/2010, 714 on 10/06/2010, 768 on 11/26/2010, 1,013 on 07/14/2011, 893 on 04/03/2012, 956 on 06/04/2012, 653 on 10/01/2012, 967 on 10/18/2012, 601 on 12/03/2012, 753 and 11/05/2013, 1,121 on 02/06/2014, 833 on 05/06/2014, 1321 on 08/19/2014, 1171 on 03/10/2016, 1411 on 12/22/2015, 1,254 on 04/06/2016, 1260 on 09/13/2016, 2000 on  04/20/2018, 4831 on 10/16/2019, 4975.0 on 11/25/2019, and 4100 on 03/17/2020.  CEA has been followed:  43 and 09/16/2009, 40.8 on 10/05/2009, 38.5 on 11/09/2009, 46.6 on 12/16/2009, 36.9 on 01/27/2010, 40.1 on 02/23/2010, 42.1 on 03/25/2010, 47.9 on 04/20/2010, 44.6 on 05/24/2010, 22.2 on 08/11/2010, 15.2 on 10/06/2010, 18.4 on 11/26/2010, 19.4 on 07/14/2011, 23 on 04/03/2012, 22.6 on 10/01/2012, 35.4 on 12/22/2015, 43.9 on 04/06/2016, 43.5 on 09/13/2016, 69.4 on  04/20/2018, 73.7 on 10/16/2019, 77.1 on 11/25/2019, and 86.4 on 03/17/2020.  EKG on 12/04/2019 revealed atrial fibrillation with a QT of 388 (QTc 453).  The patient does not plan to receive the COVID-19 vaccine.  Symptomatically, he has been fine.  He is scheduled for surgery next week. He is taking maple syrup and baking soda. He has not had any alcohol in the past 3-4 weeks. He stopped taking Eliquis and metoprolol.  Exam is stable.  Plan: 1.   Labs today:  CBC with diff, CMP. 2.   Medullary thyroid carcinoma             Clinically, he continues to have symptoms associated with neck fibrosis.             Clinical course has been indolent.             PET scan on 11/21/2019 revealed progressive disease.  Right supraclavicular biopsy confirmed metastatic medullary carcinoma.  Calcitonin and CEA have increased over time.  Previously, he received vandetinib; vandetinib has a RR of about 37% in clinical trials.              Potential side effects include increased QT, skin toxicity, diarrhea, CHF, hypertension, hemorrhage, CVA, ILD, hypocalcemia.             He is not a surgical candidate.  Phone follow-up with cardiology regarding clearance for vandetanib secondary to patient's noncompliance with atrial fibrillation management.  Review importance of abstaining from alcohol. 3.   Atrial fibrillation  Notes from cardiology reviewed.  Patient took metoprolol briefly.  He did not take Eliquis.  Free T4 was normal. 4.   Perirectal lesion  Patient is scheduled for surgery in 1 week  Discuss follow-up after surgery. 5.   EKG on 04/13/2020. 6.   Soft tissue neck on 04/13/2020.  7.   Chest CT on 04/13/2020. 8.   MD to talk to Nuala Alpha, pharmacist, Dr Ubaldo Glassing and Dr Lysle Pearl. 9.   RTC after surgery, CTs, and EKG for MD assess and initiation of oral chemotherapy.  I discussed the assessment and treatment plan with the patient.  The patient was provided an opportunity to ask questions and all  were answered.  The patient agreed with the plan and demonstrated an understanding of the instructions.  The patient was advised to call back if the symptoms worsen or if the condition fails to improve as anticipated.  I provided 21 minutes of face-to-face time during this this encounter and > 50% was spent counseling as documented under my assessment and plan.  An additional 10 minutes were spent reviewing his chart (Epic and Care Everywhere) including notes, labs, and imaging studies.   Additional time was spent contacting other providers to coordinate care.   Grant Shambley C. Mike Gip, MD, PhD    04/02/2020, 4:37 PM  I, Mirian Mo Tufford, am acting as Education administrator for [employer]. Mike Gip, MD, PhD.  I, Demara Lover C. Mike Gip, MD, have reviewed the above documentation for accuracy and completeness, and I agree with the above.

## 2020-04-02 ENCOUNTER — Other Ambulatory Visit: Payer: Self-pay

## 2020-04-02 ENCOUNTER — Inpatient Hospital Stay: Payer: Medicare HMO | Attending: Hematology and Oncology | Admitting: Hematology and Oncology

## 2020-04-02 ENCOUNTER — Encounter: Payer: Self-pay | Admitting: Hematology and Oncology

## 2020-04-02 ENCOUNTER — Other Ambulatory Visit: Payer: Medicare HMO

## 2020-04-02 VITALS — BP 117/82 | HR 91 | Temp 97.9°F | Resp 18 | Wt 171.5 lb

## 2020-04-02 DIAGNOSIS — I4891 Unspecified atrial fibrillation: Secondary | ICD-10-CM

## 2020-04-02 DIAGNOSIS — K603 Anal fistula: Secondary | ICD-10-CM | POA: Diagnosis not present

## 2020-04-02 DIAGNOSIS — C73 Malignant neoplasm of thyroid gland: Secondary | ICD-10-CM | POA: Insufficient documentation

## 2020-04-02 DIAGNOSIS — K602 Anal fissure, unspecified: Secondary | ICD-10-CM

## 2020-04-02 DIAGNOSIS — K629 Disease of anus and rectum, unspecified: Secondary | ICD-10-CM | POA: Insufficient documentation

## 2020-04-02 LAB — CBC WITH DIFFERENTIAL/PLATELET
Abs Immature Granulocytes: 0.02 10*3/uL (ref 0.00–0.07)
Basophils Absolute: 0.1 10*3/uL (ref 0.0–0.1)
Basophils Relative: 1 %
Eosinophils Absolute: 0.4 10*3/uL (ref 0.0–0.5)
Eosinophils Relative: 6 %
HCT: 44.7 % (ref 39.0–52.0)
Hemoglobin: 15.2 g/dL (ref 13.0–17.0)
Immature Granulocytes: 0 %
Lymphocytes Relative: 21 %
Lymphs Abs: 1.2 10*3/uL (ref 0.7–4.0)
MCH: 30.7 pg (ref 26.0–34.0)
MCHC: 34 g/dL (ref 30.0–36.0)
MCV: 90.3 fL (ref 80.0–100.0)
Monocytes Absolute: 0.6 10*3/uL (ref 0.1–1.0)
Monocytes Relative: 10 %
Neutro Abs: 3.7 10*3/uL (ref 1.7–7.7)
Neutrophils Relative %: 62 %
Platelets: 300 10*3/uL (ref 150–400)
RBC: 4.95 MIL/uL (ref 4.22–5.81)
RDW: 14.6 % (ref 11.5–15.5)
WBC: 5.9 10*3/uL (ref 4.0–10.5)
nRBC: 0 % (ref 0.0–0.2)

## 2020-04-03 ENCOUNTER — Telehealth: Payer: Self-pay | Admitting: *Deleted

## 2020-04-03 ENCOUNTER — Other Ambulatory Visit: Payer: Self-pay

## 2020-04-03 ENCOUNTER — Emergency Department: Payer: Medicare HMO

## 2020-04-03 ENCOUNTER — Emergency Department
Admission: EM | Admit: 2020-04-03 | Discharge: 2020-04-04 | Disposition: A | Discharge status: Home / Self Care | Payer: Medicare HMO | Attending: Emergency Medicine | Admitting: Emergency Medicine

## 2020-04-03 DIAGNOSIS — Z5321 Procedure and treatment not carried out due to patient leaving prior to being seen by health care provider: Secondary | ICD-10-CM | POA: Insufficient documentation

## 2020-04-03 DIAGNOSIS — Z85850 Personal history of malignant neoplasm of thyroid: Secondary | ICD-10-CM | POA: Diagnosis not present

## 2020-04-03 DIAGNOSIS — C73 Malignant neoplasm of thyroid gland: Secondary | ICD-10-CM | POA: Diagnosis not present

## 2020-04-03 DIAGNOSIS — R0602 Shortness of breath: Secondary | ICD-10-CM | POA: Insufficient documentation

## 2020-04-03 LAB — COMPREHENSIVE METABOLIC PANEL
ALT: 46 U/L — ABNORMAL HIGH (ref 0–44)
ALT: 45 U/L — ABNORMAL HIGH (ref 0–44)
AST: 34 U/L (ref 15–41)
AST: 37 U/L (ref 15–41)
Albumin: 4.4 g/dL (ref 3.5–5.0)
Albumin: 4.4 g/dL (ref 3.5–5.0)
Alkaline Phosphatase: 50 U/L (ref 38–126)
Alkaline Phosphatase: 51 U/L (ref 38–126)
Anion gap: 11 (ref 5–15)
Anion gap: 10 (ref 5–15)
BUN: 12 mg/dL (ref 8–23)
BUN: 9 mg/dL (ref 8–23)
CO2: 23 mmol/L (ref 22–32)
CO2: 22 mmol/L (ref 22–32)
Calcium: 9 mg/dL (ref 8.9–10.3)
Calcium: 8.4 mg/dL — ABNORMAL LOW (ref 8.9–10.3)
Chloride: 98 mmol/L (ref 98–111)
Chloride: 99 mmol/L (ref 98–111)
Creatinine, Ser: 0.79 mg/dL (ref 0.61–1.24)
Creatinine, Ser: 0.71 mg/dL (ref 0.61–1.24)
GFR, Estimated: >60 mL/min (ref >60)
GFR, Estimated: >60 mL/min (ref >60)
Glucose, Bld: 113 mg/dL — ABNORMAL HIGH (ref 70–99)
Glucose, Bld: 117 mg/dL — ABNORMAL HIGH (ref 70–99)
Potassium: 4.1 mmol/L (ref 3.5–5.1)
Potassium: 4 mmol/L (ref 3.5–5.1)
Sodium: 132 mmol/L — ABNORMAL LOW (ref 135–145)
Sodium: 131 mmol/L — ABNORMAL LOW (ref 135–145)
Total Bilirubin: 0.6 mg/dL (ref 0.3–1.2)
Total Bilirubin: 1.1 mg/dL (ref 0.3–1.2)
Total Protein: 7.7 g/dL (ref 6.5–8.1)
Total Protein: 7.3 g/dL (ref 6.5–8.1)

## 2020-04-03 LAB — CBC
HCT: 41.8 % (ref 39.0–52.0)
Hemoglobin: 14.1 g/dL (ref 13.0–17.0)
MCH: 31.2 pg (ref 26.0–34.0)
MCHC: 33.7 g/dL (ref 30.0–36.0)
MCV: 92.5 fL (ref 80.0–100.0)
Platelets: 259 10*3/uL (ref 150–400)
RBC: 4.52 MIL/uL (ref 4.22–5.81)
RDW: 14.1 % (ref 11.5–15.5)
WBC: 5.6 10*3/uL (ref 4.0–10.5)
nRBC: 0 % (ref 0.0–0.2)

## 2020-04-03 LAB — TROPONIN I (HIGH SENSITIVITY)
Troponin I (High Sensitivity): 5 ng/L (ref <18)
Troponin I (High Sensitivity): 5 ng/L (ref <18)

## 2020-04-03 MED ORDER — ALBUTEROL SULFATE HFA 108 (90 BASE) MCG/ACT IN AERS
2.0000 | INHALATION_SPRAY | RESPIRATORY_TRACT | Status: DC | PRN
  Filled 2020-04-03: qty 6.7

## 2020-04-03 NOTE — Telephone Encounter (Signed)
Pt identified himself by telling me his name and DOB .Pt states he was not told to make any medication changes related to calcium level from yesterday.

## 2020-04-03 NOTE — ED Notes (Signed)
Pt out to first nurse desk after being roomed in main department. Pt states he no longer wants to stay and has a ride coming to get him. Pt states he will return tomorrow if needed. Pt appears in no acute distress and encouraged to stay.

## 2020-04-03 NOTE — ED Triage Notes (Signed)
Pt states that he started feeling sob today so he called 911, pt ekg shows a.fib, pt denies hx of same. Pt states that when he started feeling this way he took an eliquis that was prescribed to him over a year ago.and states he feels better so it must have helped

## 2020-04-03 NOTE — ED Notes (Signed)
First Nurse Note: Pt to ED via [facility] from home for shob x 1 day. Seen by PCP yesterday for same. Hx/o thyroid cancer s/p thyroidectomy. VSS per EMS.

## 2020-04-13 ENCOUNTER — Ambulatory Visit: Payer: Self-pay | Admitting: Surgery

## 2020-04-13 ENCOUNTER — Encounter
Admission: RE | Admit: 2020-04-13 | Discharge: 2020-04-13 | Disposition: A | Discharge status: Home / Self Care | Payer: Medicare HMO | Source: Ambulatory Visit | Attending: Surgery | Admitting: Surgery

## 2020-04-13 ENCOUNTER — Other Ambulatory Visit: Payer: Self-pay

## 2020-04-13 ENCOUNTER — Ambulatory Visit
Admission: RE | Admit: 2020-04-13 | Discharge: 2020-04-13 | Disposition: A | Discharge status: Home / Self Care | Payer: Medicare HMO | Source: Ambulatory Visit | Attending: Hematology and Oncology | Admitting: Hematology and Oncology

## 2020-04-13 ENCOUNTER — Other Ambulatory Visit
Admission: RE | Admit: 2020-04-13 | Discharge: 2020-04-13 | Disposition: A | Discharge status: Home / Self Care | Payer: Medicare HMO | Source: Ambulatory Visit | Attending: Surgery | Admitting: Surgery

## 2020-04-13 DIAGNOSIS — K610 Anal abscess: Secondary | ICD-10-CM | POA: Insufficient documentation

## 2020-04-13 DIAGNOSIS — Z9119 Patient's noncompliance with other medical treatment and regimen: Secondary | ICD-10-CM | POA: Insufficient documentation

## 2020-04-13 DIAGNOSIS — I700 Atherosclerosis of aorta: Secondary | ICD-10-CM | POA: Diagnosis not present

## 2020-04-13 DIAGNOSIS — J3801 Paralysis of vocal cords and larynx, unilateral: Secondary | ICD-10-CM | POA: Insufficient documentation

## 2020-04-13 DIAGNOSIS — I719 Aortic aneurysm of unspecified site, without rupture: Secondary | ICD-10-CM | POA: Insufficient documentation

## 2020-04-13 DIAGNOSIS — Z20822 Contact with and (suspected) exposure to covid-19: Secondary | ICD-10-CM | POA: Insufficient documentation

## 2020-04-13 DIAGNOSIS — Z01812 Encounter for preprocedural laboratory examination: Secondary | ICD-10-CM | POA: Insufficient documentation

## 2020-04-13 DIAGNOSIS — C73 Malignant neoplasm of thyroid gland: Secondary | ICD-10-CM | POA: Insufficient documentation

## 2020-04-13 DIAGNOSIS — I480 Paroxysmal atrial fibrillation: Secondary | ICD-10-CM | POA: Insufficient documentation

## 2020-04-13 DIAGNOSIS — R918 Other nonspecific abnormal finding of lung field: Secondary | ICD-10-CM | POA: Insufficient documentation

## 2020-04-13 HISTORY — DX: Hypothyroidism, unspecified: E03.9

## 2020-04-13 HISTORY — DX: Unspecified atrial fibrillation: I48.91

## 2020-04-13 HISTORY — DX: Gastro-esophageal reflux disease without esophagitis: K21.9

## 2020-04-13 HISTORY — DX: Unspecified osteoarthritis, unspecified site: M19.90

## 2020-04-13 MED ORDER — IOHEXOL 300 MG/ML  SOLN
75.0000 mL | Freq: Once | INTRAMUSCULAR | Status: AC | PRN
  Administered 2020-04-13: 75 mL via INTRAVENOUS

## 2020-04-13 NOTE — Patient Instructions (Signed)
Your procedure is scheduled on:04-15-20 [facility] Report to the Registration Desk on the 1st floor of the [facility]-Then proceed to the 2nd floor Surgery Desk in the [place] To find out your arrival time, please call [phone] between 1PM - 3PM on:04-14-20 TUESDAY  REMEMBER: Instructions that are not followed completely may result in serious medical risk, up to and including death; or upon the discretion of your surgeon and anesthesiologist your surgery may need to be rescheduled.  Do not eat food after midnight the night before surgery.  No gum chewing, lozengers or hard candies.  You may however, drink CLEAR liquids up to 2 hours before you are scheduled to arrive for your surgery. Do not drink anything within 2 hours of your scheduled arrival time.  Clear liquids include: - water  - apple juice without pulp - gatorade - black coffee or tea (Do NOT add milk or creamers to the coffee or tea) Do NOT drink anything that is not on this list..  TAKE THESE MEDICATIONS THE MORNING OF SURGERY WITH A SIP OF WATER: -SYNTHROID (LEVOTHYROXINE)  Follow recommendations from Cardiologist, Pulmonologist or PCP regarding stopping Aspirin, Coumadin, Plavix, Eliquis, Pradaxa, or Pletal-PT STATES HE NEVER TAKES ELIQUIS BUT TOOK IT LAST ON 04-03-20 PRIOR TO COMING TO ED FOR AFIB AND SOB-PT INSTRUCTED NOT TO TAKE ANYMORE PRIOR TO SURGERY ON 04-15-20  One week prior to surgery: Stop Anti-inflammatories (NSAIDS) such as Advil, Aleve, Ibuprofen, Motrin, Naproxen, Naprosyn and Aspirin based products such as Excedrin, Goodys Powder, BC Powder-OK TO TAKE TYLENOL IF NEEDED  Stop ANY OVER THE COUNTER supplements until after surgery-STOP MELATONIN NOW-YOU MAY RESUME AFTER SURGERY  No Alcohol for 24 hours before or after surgery.  No Smoking including e-cigarettes for 24 hours prior to surgery.  No chewable tobacco products for at least 6 hours prior to surgery.  No nicotine patches on the day of  surgery.  Do not use any "recreational" drugs for at least a week prior to your surgery.  Please be advised that the combination of cocaine and anesthesia may have negative outcomes, up to and including death. If you test positive for cocaine, your surgery will be cancelled.  On the morning of surgery brush your teeth with toothpaste and water, you may rinse your mouth with mouthwash if you wish. Do not swallow any toothpaste or mouthwash.  Do not wear jewelry, make-up, hairpins, clips or nail polish.  Do not wear lotions, powders, or perfumes.   Do not shave body from the neck down 48 hours prior to surgery just in case you cut yourself which could leave a site for infection.  Also, freshly shaved skin may become irritated if using the CHG soap.  Contact lenses, hearing aids and dentures may not be worn into surgery.  Do not bring valuables to the hospital. [facility] is not responsible for any missing/lost belongings or valuables.   Notify your doctor if there is any change in your medical condition (cold, fever, infection).  Wear comfortable clothing (specific to your surgery type) to the hospital.  Plan for stool softeners for home use; pain medications have a tendency to cause constipation. You can also help prevent constipation by eating foods high in fiber such as fruits and vegetables and drinking plenty of fluids as your diet allows.  After surgery, you can help prevent lung complications by doing breathing exercises.  Take deep breaths and cough every 1-2 hours. Your doctor may order a device called an Chiropodist to  help you take deep breaths. When coughing or sneezing, hold a pillow firmly against your incision with both hands. This is called "splinting." Doing this helps protect your incision. It also decreases belly discomfort.  If you are being admitted to the hospital overnight, leave your suitcase in the car. After surgery it may be brought to your  room.  If you are being discharged the day of surgery, you will not be allowed to drive home. You will need a responsible adult (18 years or older) to drive you home and stay with you that night.   If you are taking public transportation, you will need to have a responsible adult (18 years or older) with you. Please confirm with your physician that it is acceptable to use public transportation.   Please call the [facility] at [phone] if you have any questions about these instructions.  Visitation Policy:  Patients undergoing a surgery or procedure may have one family member or support person with them as long as that person is not COVID-19 positive or experiencing its symptoms.  That person may remain in the waiting area during the procedure.  Inpatient Visitation:    Visiting hours are 7 a.m. to 8 p.m. Patients will be allowed one visitor. The visitor may change daily. The visitor must pass COVID-19 screenings, use hand sanitizer when entering and exiting the patient's room and wear a mask at all times, including in the patient's room. Patients must also wear a mask when staff or their visitor are in the room. Masking is required regardless of vaccination status. Systemwide, no visitors 17 or younger.

## 2020-04-13 NOTE — H&P (Signed)
Subjective:   CC: Anal abscess [K61.0]  HPI:  Grant Freeman is a 86 y.o. male who was referred by Grant Freeman Fitzgeral* for evaluation of above. First noted a few months ago.  Difficult to obtain history due to tangential nature of conversation.  Started several months ago, with intermittent sharp pain.  Denies discharge.      Past Medical History:  has a past medical history of Hypothyroidism and Thyroid cancer (CMS-HCC).  Past Surgical History:  has a past surgical history that includes Thyroidectomy Total.  Family History: reviewed and not relevant to CC  Social History:  reports that he has never smoked. He has quit using smokeless tobacco.  His smokeless tobacco use included snuff. He reports current alcohol use of about 6.0 standard drinks of alcohol per week. He reports that he does not use drugs.  Current Medications: has a current medication list which includes the following prescription(s): apixaban, euthyrox, and metoprolol succinate.  Allergies:      Allergies  Allergen Reactions  . Penicillins Hives    ROS:  A 15 point review of systems was performed and pertinent positives and negatives noted in HPI   Objective:   BP 127/83   Pulse 110   Wt 79.1 kg (174 lb 6.4 oz)   BMI 25.02 kg/m   Constitutional :  alert, appears stated age, cooperative and no distress  Lymphatics/Throat:  no asymmetry, masses, or scars  Respiratory:  clear to auscultation bilaterally  Cardiovascular:  regular rate and rhythm  Gastrointestinal: soft, non-tender; bowel sounds normal; no masses,  no organomegaly.    Musculoskeletal: Steady gait and movement  Skin: Cool and moist  Psychiatric: Normal affect, non-agitated, not confused  Rectal: Skin tag posterior midline, not irritated, no TTP.  right lateral area, 2cm from anal verge, with scant mucus discharge, no erythema, induration to indicate active infection. possible site of fistula noted on recent MRI? Internal  hemorrhoids noted on DRE. After obtaining verbal consent, an anoscope was inserted after prepped with lubricant and internal hemorrhoids noted on DRE confirmed visually, with no evidence of thrombosis. No other pathology such as fissures, obvious fistula, polyps noted.  Scope withdrawn and patient tolerate procedure well.     LABS:  n/a   RADS: CLINICAL DATA: Anal pain. Concern for anal fissure. Reported  history of benign perianal skin lesion biopsy in September. History  of metastatic medullary thyroid carcinoma.   EXAM:  MRI PELVIS WITHOUT AND WITH CONTRAST   TECHNIQUE:  Multiplanar multisequence MR imaging of the pelvis was performed  both before and after administration of intravenous contrast.   CONTRAST: 44mL GADAVIST GADOBUTROL 1 MMOL/ML IV SOLN   COMPARISON: 11/21/2019 PET-CT.   FINDINGS:  Urinary Tract: Mildly distended urinary bladder with mild diffuse  bladder wall thickening and trabeculation.   Bowel: There is an approximately 3 cm length curvilinear 6-7 o'clock  intersphincteric perianal fistula extending inferiorly to the medial  right gluteal cleft (series 8/image 25-27) with associated mild wall  thickening and enhancement. No perianal or perirectal abscess.   Vascular/Lymphatic: No acute vascular abnormality. No pathologically  enlarged lymph nodes in the visualized pelvis.   Reproductive: Small right hydrocele. Mildly enlarged prostate with  mild central gland hypertrophy.   Other: No pelvic ascites.   Musculoskeletal: No aggressive appearing focal osseous lesions.   IMPRESSION:  1. 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 No abscess.  2. Mildly distended  urinary bladder with mild diffuse bladder wall  thickening and trabeculation, likely due to chronic bladder outlet  obstruction by the mildly enlargedprostate.  3. Small right hydrocele.      Assessment:      Anal abscess [K61.0] vs fistula?   Posterior skin tag Internal hemorrhoids  On eliquis for afib  Plan:   1. Anal abscess [K61.0] vs fistula tract.  This lesion is not the area patient point out on exam today. He was more concerned about the posterior midline tag.  Previous note from surgeon states visible fissure, but noting noted today.   Discussed EUA, possible seton placement, possible tag removal.  R/b/a discussed and patient agreeable to proceed.  Due to inconsistent history reported, could not guarantee any symptom relief postop, but patient was still wishing to proceed.  Verbal consent obtained from sister who is next of kin as well to ensure no issues with proceeding.  Will obtaining risk stratification and approval to stop eliquis from cards prior.  All questions addressed at this time.    Hold eliquis 3 days prior to procedure

## 2020-04-13 NOTE — H&P (View-Only) (Signed)
Subjective:   CC: Anal abscess [K61.0]  HPI:  Grant Freeman is a 86 y.o. male who was referred by [name]* for evaluation of above. First noted a few months ago.  Difficult to obtain history due to tangential nature of conversation.  Started several months ago, with intermittent sharp pain.  Denies discharge.      Past Medical History:  has a past medical history of Hypothyroidism and Thyroid cancer (CMS-HCC).  Past Surgical History:  has a past surgical history that includes Thyroidectomy Total.  Family History: reviewed and not relevant to CC  Social History:  reports that he has never smoked. He has quit using smokeless tobacco.  His smokeless tobacco use included snuff. He reports current alcohol use of about 6.0 standard drinks of alcohol per week. He reports that he does not use drugs.  Current Medications: has a current medication list which includes the following prescription(s): apixaban, euthyrox, and metoprolol succinate.  Allergies:      Allergies  Allergen Reactions  . Penicillins Hives    ROS:  A 15 point review of systems was performed and pertinent positives and negatives noted in HPI   Objective:   BP 127/83   Pulse 110   Wt 79.1 kg (174 lb 6.4 oz)   BMI 25.02 kg/m   Constitutional :  alert, appears stated age, cooperative and no distress  Lymphatics/Throat:  no asymmetry, masses, or scars  Respiratory:  clear to auscultation bilaterally  Cardiovascular:  regular rate and rhythm  Gastrointestinal: soft, non-tender; bowel sounds normal; no masses,  no organomegaly.    Musculoskeletal: Steady gait and movement  Skin: Cool and moist  Psychiatric: Normal affect, non-agitated, not confused  Rectal: Skin tag posterior midline, not irritated, no TTP.  right lateral area, 2cm from anal verge, with scant mucus discharge, no erythema, induration to indicate active infection. possible site of fistula noted on recent MRI? Internal  hemorrhoids noted on DRE. After obtaining verbal consent, an anoscope was inserted after prepped with lubricant and internal hemorrhoids noted on DRE confirmed visually, with no evidence of thrombosis. No other pathology such as fissures, obvious fistula, polyps noted.  Scope withdrawn and patient tolerate procedure well.     LABS:  n/a   RADS: CLINICAL DATA: Anal pain. Concern for anal fissure. Reported  history of benign perianal skin lesion biopsy in September. History  of metastatic medullary thyroid carcinoma.   EXAM:  MRI PELVIS WITHOUT AND WITH CONTRAST   TECHNIQUE:  Multiplanar multisequence MR imaging of the pelvis was performed  both before and after administration of intravenous contrast.   CONTRAST: 36mL GADAVIST GADOBUTROL 1 MMOL/ML IV SOLN   COMPARISON: 11/21/2019 PET-CT.   FINDINGS:  Urinary Tract: Mildly distended urinary bladder with mild diffuse  bladder wall thickening and trabeculation.   Bowel: There is an approximately 3 cm length curvilinear 6-7 o'clock  intersphincteric perianal fistula extending inferiorly to the medial  right gluteal cleft (series 8/image 25-27) with associated mild wall  thickening and enhancement. No perianal or perirectal abscess.   Vascular/Lymphatic: No acute vascular abnormality. No pathologically  enlarged lymph nodes in the visualized pelvis.   Reproductive: Small right hydrocele. Mildly enlarged prostate with  mild central gland hypertrophy.   Other: No pelvic ascites.   Musculoskeletal: No aggressive appearing focal osseous lesions.   IMPRESSION:  1. 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 No abscess.  2. Mildly distended  urinary bladder with mild diffuse bladder wall  thickening and trabeculation, likely due to chronic bladder outlet  obstruction by the mildly enlargedprostate.  3. Small right hydrocele.      Assessment:      Anal abscess [K61.0] vs fistula?   Posterior skin tag Internal hemorrhoids  On eliquis for afib  Plan:   1. Anal abscess [K61.0] vs fistula tract.  This lesion is not the area patient point out on exam today. He was more concerned about the posterior midline tag.  Previous note from surgeon states visible fissure, but noting noted today.   Discussed EUA, possible seton placement, possible tag removal.  R/b/a discussed and patient agreeable to proceed.  Due to inconsistent history reported, could not guarantee any symptom relief postop, but patient was still wishing to proceed.  Verbal consent obtained from sister who is next of kin as well to ensure no issues with proceeding.  Will obtaining risk stratification and approval to stop eliquis from cards prior.  All questions addressed at this time.    Hold eliquis 3 days prior to procedure

## 2020-04-13 NOTE — Progress Notes (Addendum)
  Perioperative Services Pre-Admission/Anesthesia Testing     Date: 04/13/20  Name: Grant Freeman MRN:   [id number]  Re:  Plans for surgery   Case: [id number] Date/Time: 04/15/20 1446   Procedure: RECTAL EXAM UNDER ANESTHESIA (N/A Anus)   Anesthesia type: General   Pre-op diagnosis: K61.0 anal abscess   Location: [facility] OR ROOM 05 / [place] [facility] FOR ANESTHESIA [facility]   Surgeons: Benjamine Sprague, DO    Patient scheduled for the above procedure on 04/15/2020 with Dr. Benjamine Sprague.  In review of his available medical records, patient is followed by cardiology Ubaldo Glassing, MD).  Patient was last seen in the cardiology clinic on 02/10/2020.  Patient with a PMH (+) for PAF.  TTE revealed normal left ventricular systolic function with mild valvular insufficiency.  Patient was prescribed apixaban and metoprolol, however patient advising that he is not taking these medications.  Patient was cleared for surgery from a cardiovascular perspective at that point.  Patient's procedure has been scheduled for 04/15/2020 at this point.  In review of medical records for evaluation of interval events, it was noted the patient presented to the ED on 04/03/2020 via EMS with complaints of shortness of breath.  Of note, prior to arrival, patient took a single dose of apixaban prior to calling EMS.  Patient was seen in the previous day by his PCP for complaints of shortness of breath as well.  ECG performed in the ED revealed rate controlled atrial fibrillation.  CBC was normal.  Chemistries revealed minor hyponatremia; Na +131 mmol/L.  High-sensitivity troponins were negative x 2.  Citing the fact that he "felt better", patient left the ED without being seen by a provider.  Reached out to cardiology to discuss need for further evaluation prior to proceeding with currently planned procedure.  Discussed concerns regarding complaints of shortness of breath in the setting of patient with known PAF and noncompliance with prescribed  anticoagulation and beta-blocker therapies. Available information reviewed by cardiology provider Minette Brine, PA-C).  Also made mention of the fact that patient is currently being treated for thyroid cancer with oral TKI (antineoplastic) medication.  Cardiology APP reviewed the aforementioned with primary cardiologist Ubaldo Glassing, MD).  Per cardiologist, "patient ruled out for ACS in the ED.  Would feel comfortable with patient proceeding with planned procedure without further cardiovascular testing at this time".   Honor Loh, MSN, APRN, FNP-C, CEN [facility]  Peri-operative Services Nurse Practitioner Phone: [phone] 04/13/20 3:34 PM

## 2020-04-14 LAB — SARS CORONAVIRUS 2 (TAT 6-24 HRS): SARS Coronavirus 2: NEGATIVE

## 2020-04-14 MED ORDER — CHLORHEXIDINE GLUCONATE CLOTH 2 % EX PADS: 6.0000 | MEDICATED_PAD | Freq: Once | CUTANEOUS | Status: DC

## 2020-04-14 MED ORDER — FAMOTIDINE 20 MG PO TABS: 20.0000 mg | ORAL_TABLET | Freq: Once | ORAL | Status: AC

## 2020-04-14 MED ORDER — GABAPENTIN 300 MG PO CAPS: 300.0000 mg | ORAL_CAPSULE | ORAL | Status: AC

## 2020-04-14 MED ORDER — CHLORHEXIDINE GLUCONATE 0.12 % MT SOLN: 15.0000 mL | Freq: Once | OROMUCOSAL | Status: AC

## 2020-04-14 MED ORDER — LACTATED RINGERS IV SOLN: INTRAVENOUS | Status: DC

## 2020-04-14 MED ORDER — CELECOXIB 200 MG PO CAPS: 200.0000 mg | ORAL_CAPSULE | ORAL | Status: AC

## 2020-04-14 MED ORDER — ORAL CARE MOUTH RINSE: 15.0000 mL | Freq: Once | OROMUCOSAL | Status: AC

## 2020-04-14 MED ORDER — ACETAMINOPHEN 500 MG PO TABS: 1000.0000 mg | ORAL_TABLET | ORAL | Status: AC

## 2020-04-15 ENCOUNTER — Encounter
Admission: RE | Disposition: A | Discharge status: Home Health | Payer: Self-pay | Source: Home / Self Care | Attending: Surgery

## 2020-04-15 ENCOUNTER — Ambulatory Visit: Payer: Medicare HMO | Admitting: Certified Registered Nurse Anesthetist

## 2020-04-15 ENCOUNTER — Ambulatory Visit
Admission: RE | Admit: 2020-04-15 | Discharge: 2020-04-15 | Disposition: A | Discharge status: Home Health | Payer: Medicare HMO | Attending: Surgery | Admitting: Surgery

## 2020-04-15 ENCOUNTER — Encounter: Payer: Self-pay | Admitting: Surgery

## 2020-04-15 ENCOUNTER — Other Ambulatory Visit: Payer: Self-pay

## 2020-04-15 DIAGNOSIS — K621 Rectal polyp: Secondary | ICD-10-CM | POA: Diagnosis not present

## 2020-04-15 DIAGNOSIS — K602 Anal fissure, unspecified: Secondary | ICD-10-CM

## 2020-04-15 DIAGNOSIS — Z85850 Personal history of malignant neoplasm of thyroid: Secondary | ICD-10-CM | POA: Insufficient documentation

## 2020-04-15 DIAGNOSIS — Z79890 Hormone replacement therapy (postmenopausal): Secondary | ICD-10-CM | POA: Insufficient documentation

## 2020-04-15 DIAGNOSIS — Z7901 Long term (current) use of anticoagulants: Secondary | ICD-10-CM | POA: Diagnosis not present

## 2020-04-15 DIAGNOSIS — Z880 Allergy status to penicillin: Secondary | ICD-10-CM | POA: Insufficient documentation

## 2020-04-15 DIAGNOSIS — K603 Anal fistula: Secondary | ICD-10-CM | POA: Diagnosis present

## 2020-04-15 DIAGNOSIS — Z79899 Other long term (current) drug therapy: Secondary | ICD-10-CM | POA: Insufficient documentation

## 2020-04-15 HISTORY — PX: RECTAL EXAM UNDER ANESTHESIA: SHX6399

## 2020-04-15 SURGERY — EXAM UNDER ANESTHESIA, RECTUM
Anesthesia: General | Site: Anus

## 2020-04-15 MED ORDER — DOCUSATE SODIUM 100 MG PO CAPS: 100.0000 mg | ORAL_CAPSULE | Freq: Two times a day (BID) | ORAL | 0 refills | Status: AC | PRN

## 2020-04-15 MED ORDER — CHLORHEXIDINE GLUCONATE 0.12 % MT SOLN
OROMUCOSAL | Status: AC
  Administered 2020-04-15: 15 mL via OROMUCOSAL
  Filled 2020-04-15: qty 15

## 2020-04-15 MED ORDER — PROPOFOL 500 MG/50ML IV EMUL
INTRAVENOUS | Status: DC | PRN
  Administered 2020-04-15: 50 ug/kg/min via INTRAVENOUS

## 2020-04-15 MED ORDER — FENTANYL CITRATE (PF) 100 MCG/2ML IJ SOLN
INTRAMUSCULAR | Status: AC
  Filled 2020-04-15: qty 2

## 2020-04-15 MED ORDER — FENTANYL CITRATE (PF) 100 MCG/2ML IJ SOLN
INTRAMUSCULAR | Status: DC | PRN
  Administered 2020-04-15: 12.5 ug via INTRAVENOUS
  Administered 2020-04-15: 25 ug via INTRAVENOUS
  Administered 2020-04-15: 12.5 ug via INTRAVENOUS
  Administered 2020-04-15: 25 ug via INTRAVENOUS

## 2020-04-15 MED ORDER — BUPIVACAINE-EPINEPHRINE (PF) 0.5% -1:200000 IJ SOLN
INTRAMUSCULAR | Status: DC | PRN
  Administered 2020-04-15: 1 mL

## 2020-04-15 MED ORDER — GELATIN ABSORBABLE 12-7 MM EX MISC
CUTANEOUS | Status: DC | PRN
  Administered 2020-04-15: 1

## 2020-04-15 MED ORDER — EPINEPHRINE PF 1 MG/ML IJ SOLN
INTRAMUSCULAR | Status: AC
  Filled 2020-04-15: qty 1

## 2020-04-15 MED ORDER — ONDANSETRON HCL 4 MG/2ML IJ SOLN
INTRAMUSCULAR | Status: DC | PRN
  Administered 2020-04-15: 4 mg via INTRAVENOUS

## 2020-04-15 MED ORDER — IBUPROFEN 400 MG PO TABS: 400.0000 mg | ORAL_TABLET | Freq: Three times a day (TID) | ORAL | 0 refills | Status: AC | PRN

## 2020-04-15 MED ORDER — HYDROCODONE-ACETAMINOPHEN 5-325 MG PO TABS: 1.0000 | ORAL_TABLET | Freq: Four times a day (QID) | ORAL | 0 refills | Status: AC | PRN

## 2020-04-15 MED ORDER — PROPOFOL 10 MG/ML IV BOLUS
INTRAVENOUS | Status: DC | PRN
  Administered 2020-04-15 (×2): 20 mg via INTRAVENOUS

## 2020-04-15 MED ORDER — FENTANYL CITRATE (PF) 100 MCG/2ML IJ SOLN: 25.0000 ug | INTRAMUSCULAR | Status: DC | PRN

## 2020-04-15 MED ORDER — ONDANSETRON HCL 4 MG/2ML IJ SOLN: 4.0000 mg | Freq: Once | INTRAMUSCULAR | Status: DC | PRN

## 2020-04-15 MED ORDER — GABAPENTIN 300 MG PO CAPS
ORAL_CAPSULE | ORAL | Status: AC
  Administered 2020-04-15: 300 mg via ORAL
  Filled 2020-04-15: qty 1

## 2020-04-15 MED ORDER — GELATIN ABSORBABLE 12-7 MM EX MISC
CUTANEOUS | Status: AC
  Filled 2020-04-15: qty 1

## 2020-04-15 MED ORDER — FAMOTIDINE 20 MG PO TABS
ORAL_TABLET | ORAL | Status: AC
  Administered 2020-04-15: 20 mg via ORAL
  Filled 2020-04-15: qty 1

## 2020-04-15 MED ORDER — CELECOXIB 200 MG PO CAPS
ORAL_CAPSULE | ORAL | Status: AC
  Administered 2020-04-15: 200 mg via ORAL
  Filled 2020-04-15: qty 1

## 2020-04-15 MED ORDER — BUPIVACAINE HCL (PF) 0.5 % IJ SOLN
INTRAMUSCULAR | Status: AC
  Filled 2020-04-15: qty 30

## 2020-04-15 MED ORDER — ACETAMINOPHEN 500 MG PO TABS
ORAL_TABLET | ORAL | Status: AC
  Administered 2020-04-15: 1000 mg via ORAL
  Filled 2020-04-15: qty 2

## 2020-04-15 MED ORDER — LIDOCAINE HCL (CARDIAC) PF 100 MG/5ML IV SOSY
PREFILLED_SYRINGE | INTRAVENOUS | Status: DC | PRN
  Administered 2020-04-15: 60 mg via INTRAVENOUS

## 2020-04-15 SURGICAL SUPPLY — 35 items
BLADE SURG 15 STRL LF DISP TIS (BLADE) ×1 IMPLANT
BLADE SURG 15 STRL SS (BLADE) ×1
BRIEF STRETCH MATERNITY 2XLG (MISCELLANEOUS) IMPLANT
CANISTER SUCT 1200ML W/VALVE (MISCELLANEOUS) IMPLANT
COVER WAND RF STERILE (DRAPES) IMPLANT
DRAIN PENROSE 1/4X12 LTX STRL (WOUND CARE) ×2 IMPLANT
DRAPE PERI LITHO V/GYN (MISCELLANEOUS) ×2 IMPLANT
DRSG GAUZE FLUFF 36X18 (GAUZE/BANDAGES/DRESSINGS) IMPLANT
ELECT REM PT RETURN 9FT ADLT (ELECTROSURGICAL) ×2
ELECTRODE REM PT RTRN 9FT ADLT (ELECTROSURGICAL) ×1 IMPLANT
GAUZE SPONGE 4X4 12PLY STRL (GAUZE/BANDAGES/DRESSINGS) ×2 IMPLANT
GLOVE SURG SYN 6.5 ES PF (GLOVE) ×2 IMPLANT
GLOVE SURG UNDER POLY LF SZ7 (GLOVE) ×2 IMPLANT
GOWN STRL REUS W/ TWL LRG LVL3 (GOWN DISPOSABLE) ×2 IMPLANT
GOWN STRL REUS W/TWL LRG LVL3 (GOWN DISPOSABLE) ×2
KIT TURNOVER CYSTO (KITS) ×2 IMPLANT
LABEL OR SOLS (LABEL) ×2 IMPLANT
LOOP RED MAXI  1X406MM (MISCELLANEOUS)
LOOP VESSEL MAXI 1X406 RED (MISCELLANEOUS) IMPLANT
MANIFOLD NEPTUNE II (INSTRUMENTS) IMPLANT
NDL SAFETY ECLIPSE 18X1.5 (NEEDLE) ×1 IMPLANT
NEEDLE HYPO 18GX1.5 SHARP (NEEDLE) ×1
NEEDLE HYPO 25X1 1.5 SAFETY (NEEDLE) ×2 IMPLANT
NS IRRIG 500ML POUR BTL (IV SOLUTION) ×2 IMPLANT
PACK BASIN MINOR ARMC (MISCELLANEOUS) ×2 IMPLANT
PAD PREP 24X41 OB/GYN DISP (PERSONAL CARE ITEMS) ×2 IMPLANT
SOL PREP PVP 2OZ (MISCELLANEOUS) ×2
SOLUTION PREP PVP 2OZ (MISCELLANEOUS) ×1 IMPLANT
SURGILUBE 2OZ TUBE FLIPTOP (MISCELLANEOUS) ×2 IMPLANT
SUT SILK 0 SH 30 (SUTURE) IMPLANT
SUT SILK 3 0 SH 30 (SUTURE) IMPLANT
SUT VIC AB 3-0 SH 27 (SUTURE)
SUT VIC AB 3-0 SH 27X BRD (SUTURE) IMPLANT
SYR 10ML LL (SYRINGE) ×2 IMPLANT
TOWEL OR 17X26 4PK STRL BLUE (TOWEL DISPOSABLE) IMPLANT

## 2020-04-15 NOTE — Interval H&P Note (Signed)
History and Physical Interval Note:  04/15/2020 2:10 PM  Grant Freeman  has presented today for surgery, with the diagnosis of K61.0 anal abscess.  The various methods of treatment have been discussed with the patient and family. After consideration of risks, benefits and other options for treatment, the patient has consented to  Procedure(s): RECTAL EXAM UNDER ANESTHESIA (N/A) as a surgical intervention.  The patient's history has been reviewed, patient examined, no change in status, stable for surgery.  I have reviewed the patient's chart and labs.  Questions were answered to the patient's satisfaction.    Pt states he is still having itching in the area with occasional discomfort, no additional discharge.  Eliquis held.  Understands no guarantee of symptom resolution   Taquan Bralley Lysle Pearl

## 2020-04-15 NOTE — Anesthesia Preprocedure Evaluation (Addendum)
Anesthesia Evaluation  Patient identified by MRN, date of birth, ID band Patient awake    Reviewed: Allergy & Precautions, H&P , NPO status , Patient's Chart, lab work & pertinent test results  History of Anesthesia Complications Negative for: history of anesthetic complications  Airway Mallampati: II  TM Distance: >3 FB Neck ROM: full    Dental  (+) Missing   Pulmonary neg pulmonary ROS, neg sleep apnea, neg COPD,    breath sounds clear to auscultation       Cardiovascular (-) angina(-) Past MI and (-) Cardiac Stents + dysrhythmias (not taking anticoagulation) Atrial Fibrillation  Rhythm:irregular Rate:Normal     Neuro/Psych negative neurological ROS  negative psych ROS   GI/Hepatic Neg liver ROS, GERD  Controlled,  Endo/Other  Hypothyroidism Thyroid cancer  Renal/GU negative Renal ROS  negative genitourinary   Musculoskeletal   Abdominal   Peds  Hematology negative hematology ROS (+)   Anesthesia Other Findings Past Medical History: No date: Arthritis No date: Atrial fibrillation (HCC) No date: GERD (gastroesophageal reflux disease)     Comment:  h/o No date: Hypothyroidism No date: Thyroid cancer, medullary carcinoma (HCC)   Past Surgical History: No date: COLONOSCOPY No date: THYROIDECTOMY  BMI    Body Mass Index: 25.11 kg/m      Reproductive/Obstetrics negative OB ROS                            Anesthesia Physical Anesthesia Plan  ASA: II  Anesthesia Plan: General   Post-op Pain Management:    Induction:   PONV Risk Score and Plan: Propofol infusion and TIVA  Airway Management Planned:   Additional Equipment:   Intra-op Plan:   Post-operative Plan:   Informed Consent: I have reviewed the patients History and Physical, chart, labs and discussed the procedure including the risks, benefits and alternatives for the proposed anesthesia with the patient or  authorized representative who has indicated his/her understanding and acceptance.     Dental Advisory Given  Plan Discussed with: Anesthesiologist, CRNA and Surgeon  Anesthesia Plan Comments: (Jehova's witness - no blood products)        Anesthesia Quick Evaluation

## 2020-04-15 NOTE — Transfer of Care (Signed)
Immediate Anesthesia Transfer of Care Note  Patient: Grant Freeman  Procedure(s) Performed: RECTAL EXAM UNDER ANESTHESIA (N/A Anus)  Patient Location: PACU  Anesthesia Type:General  Level of Consciousness: awake, drowsy and patient cooperative  Airway & Oxygen Therapy: Patient Spontanous Breathing  Post-op Assessment: Report given to RN and Post -op Vital signs reviewed and stable  Post vital signs: Reviewed and stable  Last Vitals:  Vitals Value Taken Time  BP 98/81 04/15/20 1545  Temp    Pulse 68 04/15/20 1548  Resp 16 04/15/20 1548  SpO2 94 % 04/15/20 1548  Vitals shown include unvalidated device data.  Last Pain:  Vitals:   04/15/20 1307  TempSrc: Oral  PainSc: 0-No pain         Complications: No complications documented.

## 2020-04-15 NOTE — Op Note (Signed)
Preoperative diagnosis: Anal fistula Postoperative diagnosis: anal polyp  Procedure: Exam under anesthesia, removal of anal polyp Anesthesia: MAC  Surgeon: Lysle Pearl  Wound Classification: Clean Contaminated  Specimen: None  Complications: None  EBL: 3 mL  Indications:  Patient is a 86 y.o. male with clinical exam and MRI confirmed anal fistula.  Here today for formal exam under anesthesia due to persistent symptoms. See H&P for further details.  Description of procedure: The patient was taken to the operating room and placed in high lithotomy position.  LMA anesthesia was induced without any difficulty. A time-out was completed verifying correct patient, procedure, site, positioning, and implant(s) and/or special equipment prior to beginning this procedure.  Digital rectal exam noted no obvious indurated tissue or palpable fistulous tracts within the rectum.  He did have redundant tissue and what looked like a skin tag in the area noted to have fistula on MRI at 7 oclock position. Further inspection noted what looks likes a healed fistula opening within the anal canal and about 3cm from the anal verge on medial aspect of right gluteus.  No opening present anymore.  Tag like tissue adjacent to this opening removed with cautery after injection of local, sent to pathology.  Wound then closed with 3-0 vicryl x1.  Hemostasis confirmed prior to placing surgifoam in rectum.  Skin tag note at 5 oclock position, but No other pathology such as fissure, hemorrhoids noted on exam.  The patient tolerated the procedure well and  taken to the postoperative care unit in stable condition.  Sponge and instrument count was correct at the end of the procedure.

## 2020-04-15 NOTE — Discharge Instructions (Signed)
EUA, Care After This sheet gives you information about how to care for yourself after your procedure. Your health care provider may also give you more specific instructions. If you have problems or questions, contact your health care provider. What can I expect after the procedure? After the procedure, it is common to have:  Soreness.  Bruising.  Itching. Follow these instructions at home: site care Follow instructions from your health care provider about how to take care of your site. Make sure you:  Wash your hands with soap and water before and after you change your bandage (dressing). If soap and water are not available, use hand sanitizer.  Leave stitches (sutures), skin glue, or adhesive strips in place. These skin closures may need to stay in place for 2 weeks or longer. If adhesive strip edges start to loosen and curl up, you may trim the loose edges. Do not remove adhesive strips completely unless your health care provider tells you to do that.  If the area bleeds or bruises, apply gentle pressure for 10 minutes.  OK TO SHOWER IN 24HRS  Check your site every day for signs of infection. Check for:  Redness, swelling, or pain.  Fluid or blood.  Warmth.  Pus or a bad smell.  General instructions  Rest and then return to your normal activities as tolerated  Special dressing was placed in your rectum.  If this falls out, that is ok.  Advil as needed for discomfort.     Use narcotics, if prescribed, only when advil is not enough to control pain.    Advil up to 400mg  per dose every 8hrs as needed for pain.    Keep all follow-up visits as told by your health care provider. This is important. Contact a health care provider if:  You have redness, swelling, or pain around your site.  You have fluid or blood coming from your site.  Your site feels warm to the touch.  You have pus or a bad smell coming from your site.  You have a fever.  Your sutures, skin glue,  or adhesive strips loosen or come off sooner than expected. Get help right away if:  You have bleeding that does not stop with pressure or a dressing. Summary  After the procedure, it is common to have some soreness, bruising, and itching at the site.  Follow instructions from your health care provider about how to take care of your site.  Check your site every day for signs of infection.  Contact a health care provider if you have redness, swelling, or pain around your site, or your site feels warm to the touch.  Keep all follow-up visits as told by your health care provider. This is important. This information is not intended to replace advice given to you by your health care provider. Make sure you discuss any questions you have with your health care provider. Document Released: 03/13/2015 Document Revised: 08/14/2017 Document Reviewed: 08/14/2017 Elsevier Interactive Patient Education  2019 [place]   1) The drugs that you were given will stay in your system until tomorrow so for the next 24 hours you should not:  A) Drive an automobile B) Make any legal decisions C) Drink any alcoholic beverage   2) You may resume regular meals tomorrow.  Today it is better to start with liquids and gradually work up to solid foods.  You may eat anything you prefer, but it is better to start with  liquids, then soup and crackers, and gradually work up to solid foods.   3) Please notify your doctor immediately if you have any unusual bleeding, trouble breathing, redness and pain at the surgery site, drainage, fever, or pain not relieved by medication.    4) Additional Instructions:        Please contact your physician with any problems or Same Day Surgery at [phone], Monday through Friday 6 am to 4 pm, or [place] at [facility] number at [phone].

## 2020-04-16 ENCOUNTER — Encounter: Payer: Self-pay | Admitting: Surgery

## 2020-04-16 NOTE — Anesthesia Postprocedure Evaluation (Signed)
Anesthesia Post Note  Patient: Grant Freeman  Procedure(s) Performed: RECTAL EXAM UNDER ANESTHESIA WITH REMOVAL OF RECTAL POLYP (N/A Anus)  Patient location during evaluation: PACU Anesthesia Type: General Level of consciousness: awake and alert and oriented Pain management: pain level controlled Vital Signs Assessment: post-procedure vital signs reviewed and stable Cardiovascular status: blood pressure returned to baseline Anesthetic complications: no   No complications documented.   Last Vitals:  Vitals:   04/15/20 1610 04/15/20 1614  BP: 113/71 (!) 150/92  Pulse:  78  Resp:  18  Temp: (!) 36.3 C (!) 36.1 C  SpO2: 98% 99%    Last Pain:  Vitals:   04/15/20 1614  TempSrc: Temporal  PainSc: 0-No pain                 Kyana Aicher

## 2020-04-17 LAB — SURGICAL PATHOLOGY

## 2020-07-14 ENCOUNTER — Inpatient Hospital Stay: Payer: Medicare HMO | Attending: Nurse Practitioner | Admitting: Nurse Practitioner

## 2020-08-11 ENCOUNTER — Inpatient Hospital Stay: Payer: Medicare HMO | Attending: Oncology | Admitting: Oncology

## 2020-08-11 ENCOUNTER — Other Ambulatory Visit: Payer: Self-pay

## 2020-08-11 ENCOUNTER — Inpatient Hospital Stay: Payer: Medicare HMO

## 2020-08-11 VITALS — BP 135/90 | HR 97 | Temp 98.0°F | Resp 18 | Wt 172.0 lb

## 2020-08-11 DIAGNOSIS — C73 Malignant neoplasm of thyroid gland: Secondary | ICD-10-CM

## 2020-08-11 DIAGNOSIS — R970 Elevated carcinoembryonic antigen [CEA]: Secondary | ICD-10-CM | POA: Diagnosis not present

## 2020-08-11 DIAGNOSIS — Z79899 Other long term (current) drug therapy: Secondary | ICD-10-CM | POA: Insufficient documentation

## 2020-08-11 DIAGNOSIS — R918 Other nonspecific abnormal finding of lung field: Secondary | ICD-10-CM | POA: Diagnosis not present

## 2020-08-11 DIAGNOSIS — I4891 Unspecified atrial fibrillation: Secondary | ICD-10-CM | POA: Insufficient documentation

## 2020-08-11 DIAGNOSIS — K769 Liver disease, unspecified: Secondary | ICD-10-CM | POA: Diagnosis not present

## 2020-08-11 LAB — TSH: TSH: 2.326 u[IU]/mL (ref 0.350–4.500)

## 2020-08-11 LAB — COMPREHENSIVE METABOLIC PANEL WITH GFR
ALT: 26 U/L (ref 0–44)
AST: 28 U/L (ref 15–41)
Albumin: 4.7 g/dL (ref 3.5–5.0)
Alkaline Phosphatase: 45 U/L (ref 38–126)
Anion gap: 7 (ref 5–15)
BUN: 13 mg/dL (ref 8–23)
CO2: 25 mmol/L (ref 22–32)
Calcium: 9.1 mg/dL (ref 8.9–10.3)
Chloride: 104 mmol/L (ref 98–111)
Creatinine, Ser: 0.74 mg/dL (ref 0.61–1.24)
GFR, Estimated: >60 mL/min
Glucose, Bld: 99 mg/dL (ref 70–99)
Potassium: 4.5 mmol/L (ref 3.5–5.1)
Sodium: 136 mmol/L (ref 135–145)
Total Bilirubin: 1.1 mg/dL (ref 0.3–1.2)
Total Protein: 8 g/dL (ref 6.5–8.1)

## 2020-08-11 LAB — CBC WITH DIFFERENTIAL/PLATELET
Abs Immature Granulocytes: 0.03 K/uL (ref 0.00–0.07)
Basophils Absolute: 0.1 K/uL (ref 0.0–0.1)
Basophils Relative: 1 %
Eosinophils Absolute: 0.3 K/uL (ref 0.0–0.5)
Eosinophils Relative: 5 %
HCT: 40.8 % (ref 39.0–52.0)
Hemoglobin: 14 g/dL (ref 13.0–17.0)
Immature Granulocytes: 1 %
Lymphocytes Relative: 15 %
Lymphs Abs: 0.8 K/uL (ref 0.7–4.0)
MCH: 33.3 pg (ref 26.0–34.0)
MCHC: 34.3 g/dL (ref 30.0–36.0)
MCV: 97.1 fL (ref 80.0–100.0)
Monocytes Absolute: 0.7 K/uL (ref 0.1–1.0)
Monocytes Relative: 12 %
Neutro Abs: 3.6 K/uL (ref 1.7–7.7)
Neutrophils Relative %: 66 %
Platelets: 270 K/uL (ref 150–400)
RBC: 4.2 MIL/uL — ABNORMAL LOW (ref 4.22–5.81)
RDW: 15.9 % — ABNORMAL HIGH (ref 11.5–15.5)
WBC: 5.4 K/uL (ref 4.0–10.5)
nRBC: 0 % (ref 0.0–0.2)

## 2020-08-11 NOTE — Progress Notes (Signed)
[facility]  [address] [place], [place] [address] Phone: [phone]  Fax: [phone]   Clinic Day:  08/11/2020  Referring physician: Leonel Ramsay, MD  Chief Complaint: Grant Freeman is a 86 y.o. male with medullary thyroid cancer who is seen for 3 month assessment.  HPI: The patient was last seen in the medical oncology clinic on 04/02/2020.  He was seen on 04/13/2020 for surgery by Dr. Lysle Freeman for an anal abscess verse fistula tract. Discussed EUA, possible seton placement, and possible tag removal; patient agreed to proceed.  Echocardiogram on 01/31/2020 revealed an EF of >55%.  During the interim, he has been fine.  Patient has not been seen in our clinic in about 4 months.  He was scheduled for imaging and additional work-up prior to initiating an oral chemotherapy but was lost to follow-up.  He had a CT soft tissue neck and chest with contrast unfortunately revealing progression of disease.  He has several solid pulmonary nodules throughout both lungs, stable 2 cm soft tissue mass in the right tracheoesophageal groove, stable mild right supraclavicular lymphadenopathy and 3 small hypodense lesions scattered in the liver.   He has been using syrup and baking soda daily as a home remedy for his cancer.  He believes this has made his entire body "acidic".  Reports some discomfort with urination, sores on his bottom and testicles and problems with having bowel movements.  He is not taking medications for atrial fibrillation.  States this problem has resolved on its own.  Past Medical History:  Diagnosis Date   Arthritis    Atrial fibrillation (HCC)    GERD (gastroesophageal reflux disease)    h/o   Hypothyroidism    Thyroid cancer, medullary carcinoma ([place])     Past Surgical History:  Procedure Laterality Date   COLONOSCOPY     RECTAL EXAM UNDER ANESTHESIA N/A 04/15/2020   Procedure: RECTAL EXAM UNDER ANESTHESIA WITH REMOVAL OF  RECTAL POLYP;  Surgeon: Grant Sprague, DO;  Location: [facility] ORS;  Service: General;  Laterality: N/A;   THYROIDECTOMY      No family history on file.  Social History:  reports that he has never smoked. He has quit using smokeless tobacco. He reports current alcohol use of about 2.0 standard drinks of alcohol per week. He reports that he does not use drugs. The patient has not had any alcohol in 3-4 weeks. When he was drinking, he drank 1 bottle of wine and a 6 pack of beer in a weekend. Denies tobacco or drug use. He still lives in [place]. He is a retired Clinical biochemist. The patient is alone today.  Allergies:  Allergies  Allergen Reactions   Penicillins Hives    Current Medications: Current Outpatient Medications  Medication Sig Dispense Refill   apixaban (ELIQUIS) 5 MG TABS tablet Take 5 mg by mouth as needed (last took 04-03-20).     HYDROcodone-acetaminophen (NORCO) 5-325 MG tablet Take 1 tablet by mouth every 6 (six) hours as needed for up to 6 doses for moderate pain. 6 tablet 0   ibuprofen (ADVIL) 400 MG tablet Take 1 tablet (400 mg total) by mouth every 8 (eight) hours as needed for mild pain or moderate pain. 30 tablet 0   levothyroxine (SYNTHROID, LEVOTHROID) 112 MCG tablet Take 112 mcg by mouth daily before breakfast.     Melatonin 10 MG CAPS Take 20 mg by mouth at bedtime.     No current facility-administered medications for this visit.  Review of Systems  Constitutional: Negative.  Negative for chills, fever, malaise/fatigue and weight loss.  HENT:  Negative for congestion, ear pain and tinnitus.   Eyes: Negative.  Negative for blurred vision and double vision.  Respiratory: Negative.  Negative for cough, sputum production and shortness of breath.   Cardiovascular: Negative.  Negative for chest pain, palpitations and leg swelling.  Gastrointestinal:  Positive for constipation and diarrhea. Negative for abdominal pain, nausea and vomiting.  Genitourinary:  Positive for  dysuria. Negative for frequency and urgency.  Musculoskeletal:  Negative for back pain and falls.  Skin:  Positive for rash.  Neurological: Negative.  Negative for weakness and headaches.  Endo/Heme/Allergies: Negative.  Does not bruise/bleed easily.  Psychiatric/Behavioral: Negative.  Negative for depression. The patient is not nervous/anxious and does not have insomnia.   Performance status (ECOG): 1  Vitals There were no vitals taken for this visit.   Physical Exam Constitutional:      Appearance: He is well-developed.  HENT:     Head: Normocephalic and atraumatic.     Comments: hoarse voice Eyes:     Pupils: Pupils are equal, round, and reactive to light.  Cardiovascular:     Rate and Rhythm: Normal rate and regular rhythm.     Heart sounds: No murmur heard. Pulmonary:     Effort: Pulmonary effort is normal.     Breath sounds: Normal breath sounds. No wheezing.  Abdominal:     General: Bowel sounds are normal. There is no distension.     Palpations: Abdomen is soft. There is no mass.     Tenderness: There is no abdominal tenderness.  Musculoskeletal:        General: Normal range of motion.     Cervical back: Normal range of motion.  Skin:    General: Skin is warm and dry.  Neurological:     Mental Status: He is alert and oriented to person, place, and time.  Psychiatric:        Behavior: Behavior normal.   No visits with results within 3 Day(s) from this visit.  Latest known visit with results is:  Admission on 04/15/2020, Discharged on 04/15/2020  Component Date Value Ref Range Status   SURGICAL PATHOLOGY 04/15/2020    Final-Edited                   Value:SURGICAL PATHOLOGY CASE: [id number] PATIENT: Grant Freeman Surgical Pathology Report     Specimen Submitted: A. Rectum polyp  Clinical History: K61.0 anal abscess      DIAGNOSIS: A. RECTUM POLYP; BIOPSY: - FIBROEPITHELIAL POLYP, INFLAMED. - NEGATIVE FOR DYSPLASIA AND MALIGNANCY.   GROSS  DESCRIPTION: A. Labeled: Rectal polyp Received: In formalin Collection time: 3:25 PM on 04/15/20 Placed into formalin time: 3:30 PM Tissue fragment(s): 1 Size: 0.7 x 0.5 x 0.3 cm Description: Tan polypoid fragment, probable base inked, bisected Entirely submitted in 1 cassette.  Final Diagnosis performed by Betsy Pries, MD.   Electronically signed 04/17/2020 11:11:36AM The electronic signature indicates that the named Attending Pathologist has evaluated the specimen Technical component performed at [facility], [address], [place], [place] [address] Lab: [phone] Dir: Rush Farmer, MD, MMM  Professional component performed at [facility], [facility], [place], [place], [place] [address] Lab: [phone] Dir: Dellia Nims. Reuel Derby, MD     Assessment:  Grant Rankin  Freeman is a 86 y.o. male with stage IV medullary thyroid cancer s/p medullary thyroid cancer s/p bilateral thyroidectomy 07/06/2000.  Pathology revealed a 5.2 cm medullary carcinoma. Cancer extended up to but not through the thyroid capsule. 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 Right sentinel lymph node was negative. Left central neck lymph node was positive with a small focus of metastatic medullary carcinoma. Two lymph nodes in the left upper mediastinum were negative for cancer and a third paratracheal lymph node was negative.   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  Variants of uncertain significance included: ALK c.1184G>A (p.Arg395His) and NF1 c.5288A>T (p.Tyr1763Phe).  He received an unknown number cycles of carboplatin and Taxol beginning in 11/2001.  He developed recurrent disease in 2006. He underwent right neck dissection on 10/04/2004.  There was a 4.2 cm soft  tissue mass of recurrent medullary thyroid cancer. Four of 4 right-sided neck nodes were positive.  The largest node was 4.2 cm with extracapsular extension into the soft tissue.  A right neck level IIB dissection revealed 8 of 11 lymph nodes positive for metastatic thyroid cancer. The largest focus was 5 mm with tumor present in perinodal lymphatics.   He received DTIC (dacarbazine) with some improvement in serum markers in 2010.  PET scan on 03/11/2010 revealed increased FDG uptake in the left base of neck evidence of distant metastasis.  Notes indicate additional neck dissection in 2012 for recurrent disease.   He received vandetanib from 05/2009 through 05/17/2010. Treatment was interrupted in 12/2009 secondary to an infection in his arm. Dose was reduced from 300 mg a day to 02/2010 at 200 mg a day in 02/2010. He believes his tumor marker dramatically improved with therapy.  He received no TKI therapy between 2012 in 2014 secondary to patient refusal. Calcitonin remained in the 875 range in 03/2012.   In 03/2012, he began ingesting and applying "blood root" to his neck.  PET scan on 10/18/2012 revealed an increase in a 3.74m nodule to 562m but no visible disease in neck, mediastinum or abdomen.  Recommendation was for no vandetanib.   PET scan on 04/01/2016 was suboptimal secondary to altered biomechanics after recent food ingestion prior to the scan. There was new retrotracheal, paraesophageal soft tissue nodule without increased metabolic activity (may falsely lower SUV secondary to altered biomechanics).  There was unchanged subcentimeter pulmonary nodule in the right upper lobe which was below the size threshold for PET imaging.   Neck soft tissue CT scan on 04/01/2016 revealed new 1.2 x 0.9 cm retrotracheal, paraesophageal enhancing soft tissue nodule suspicious for metastatic disease, but does not appear to be associated with definite increased metabolic activity.  There was redemonstration of a  1.6 x 0.9 cm enhancing soft tissue nodules in the right thyroid bed and adjacent to the cricothyroid cartilage.  There was no significant interval change in size of right 6 mm apical pulmonary nodule.   [facility] 09[facility]30/2016 revealed redemonstrated minimally FDG avid retrotracheal, paraesophageal soft tissue with limited comparison to prior study given previous muscle uptake. This remained concerning for possible metastatic disease. There was unchanged CT appearance of enhancing cricothyroid soft tissue also with mild FDG avidity, likely ectopic thyroid tissue or level VI lymph nodes;  metastatic disease was not excluded. There was no evidence of new FDG avid disease.  Diagnostic neck CT on 09/30/2016 revealed stable appearance of right retrotracheal, paraesophageal enhancing soft tissue nodule (1.3 x 0.9 cm compared to 1.2 x 0.9 cm). This lesion was suspicious for metastatic disease. Attention on follow-up.  There was redemonstration of extensive enhancing soft tissue nodules in the right thyroid bed and adjacent the cricothyroid cartilage which were unchanged in size and may represent residual or regenerated thyroid tissue.  There was opacification of the maxillary, ethmoid and frontal sinuses which may be seen in the setting of sinusitis.  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    PET scan on 11/21/2019 noted diffuse low level hypermetabolism seen in the left thyroid cartilage region including asymmetric soft tissue just superficial to the cartilage. This may reflect the FDG avid "cricopharyngeal soft tissue" described in the outside report from 2018. Metastatic disease remained a concern. There was a 1.9 cm right retrotracheal soft tissue nodule in the region of the thoracic inlet shows low level hypermetabolism, concerning for metastatic disease. There was a 9 mm anterior right upper lobe pulmonary nodule increased from 4 mm on the study 9 years ago.  There was no hypermetabolism.  Relatively slow growth would suggest a benign etiology, but follow-up may be warranted to exclude low-grade, poorly FDG avid neoplasm. There was a 5 mm right lower lobe pulmonary nodule new since 2012. Continue attention on follow-up recommended. There was hepatic steatosis.  Soft tissue neck CT on 11/22/2019 revealed a 2 cm mass at the right tracheoesophageal groove new from PET scan in 2012, presumed local recurrence. A 7 x 17 mm nodule anterior to the upper cricoid cartilage was also worrisome, but not hypermetabolic. There was a 11 mm lymph node at the junction of the right supraclavicular fossa and IJ chain, larger than in 2012 and suspicious.  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 There was a right upper lobe pulmonary nodule, reference preceding PET-CT.  Right supraclavicular biopsy on 12/11/2019 confirmed metastatic carcinoma.  IHC for calcitonin was diffuse and strong staining confirming the diagnosis of metastatic medullary carcinoma of the thyroid.  Calcitonin has been followed:  2,721 on 05/24/2010, 706 on 08/11/2010, 714 on 10/06/2010, 768 on 11/26/2010, 1,013 on 07/14/2011, 893 on 04/03/2012, 956 on 06/04/2012, 653 on 10/01/2012, 967 on 10/18/2012, 601 on 12/03/2012, 753 and 11/05/2013, 1,121 on 02/06/2014, 833 on 05/06/2014, 1321 on 08/19/2014, 1171 on 03/10/2016, 1411 on 12/22/2015, 1,254 on 04/06/2016, 1260 on 09/13/2016, 2000 on 04/20/2018, 4831 on 10/16/2019, 4975.0 on 11/25/2019, and 4100 on 03/17/2020.   CEA has been followed:  43 and 09/16/2009, 40.8 on 10/05/2009, 38.5 on 11/09/2009, 46.6 on 12/16/2009, 36.9 on 01/27/2010, 40.1 on 02/23/2010, 42.1 on 03/25/2010, 47.9 on 04/20/2010, 44.6 on 05/24/2010, 22.2 on 08/11/2010, 15.2 on 10/06/2010, 18.4 on 11/26/2010, 19.4 on 07/14/2011, 23 on 04/03/2012, 22.6 on 10/01/2012, 35.4 on 12/22/2015, 43.9 on 04/06/2016, 43.5 on 09/13/2016, 69.4 on 04/20/2018, 73.7 on 10/16/2019, 77.1 on 11/25/2019, and 86.4 on 03/17/2020.  EKG on 12/04/2019  revealed atrial fibrillation with a QT of 388 (QTc 453).  The patient does not plan to receive the COVID-19 vaccine.  Symptomatically, he is frustrated.  He is concerned that he was never started on his oral chemotherapy as instructed by Dr. Mike Gip.  Exam is stable.  Plan: Medullary thyroid carcinoma Clinically, he continues to have symptoms associated with neck fibrosis. Clinical course has been indolent. Recent imaging reveals progressive disease.  PET scan on 11/21/2019 revealed progressive disease. Right supraclavicular biopsy confirmed metastatic medullary carcinoma. Calcitonin and CEA have  increased over time.  Today's labs are pending. Previously, he received vandetinib; vandetinib has a RR of about 37% in clinical trials. He is not a surgical candidate.  Atrial fibrillation Not taking anything at this time.  TSH and free T4 are pending during dictation.  Perirectal lesion Patient had surgery on 04/15/2020 with Dr. Lysle Freeman. Results from biopsy were negative for dysplasia and malignancy.  Disposition: Patient was supposed to follow-up with Dr. Mike Gip shortly after his CT scans and February 2022 but was lost to follow-up.  Dr. Mike Gip had discussed with Grant Freeman, oral chemotherapy pharmacist, Dr. Ubaldo Glassing Dr. Lysle Freeman regarding starting vandetinib.  Based on results of imaging, he has had significant progression of disease.  Patient is under the impression he is going to be in the oral chemotherapy now.  I have discussed with him that I would like him to repeat some lab work today and return to clinic in 1 week to discuss with an oncologist plans moving forward.  He is in agreement.  Greater than 50% was spent in counseling and coordination of care with this patient including but not limited to discussion of the relevant topics above (See A&P) including, but not limited to diagnosis and management of acute and chronic medical conditions.   Faythe Casa, NP 08/11/2020 8:08 PM

## 2020-08-12 LAB — CEA: CEA: 84.2 ng/mL — ABNORMAL HIGH (ref 0.0–4.7)

## 2020-08-12 LAB — T4: T4, Total: 7.3 ug/dL (ref 4.5–12.0)

## 2020-08-14 ENCOUNTER — Other Ambulatory Visit: Payer: Self-pay | Admitting: Oncology

## 2020-08-14 ENCOUNTER — Other Ambulatory Visit: Payer: Self-pay | Admitting: *Deleted

## 2020-08-14 DIAGNOSIS — C73 Malignant neoplasm of thyroid gland: Secondary | ICD-10-CM

## 2020-08-17 ENCOUNTER — Telehealth: Payer: Self-pay | Admitting: Oncology

## 2020-08-17 NOTE — Telephone Encounter (Signed)
Left VM with patient to let him know about scans that have been rescheduled (and MD f/u as a result). Also sending updated AVS in the mail.

## 2020-08-18 ENCOUNTER — Telehealth: Payer: Self-pay | Admitting: Oncology

## 2020-08-18 NOTE — Telephone Encounter (Signed)
Pt came to [facility] stating he does not understand why he needed CT scan scheduled on 7/1, he wanted to be seen sooner. He stated he was not feeling well and insisted he did not need the scan and wanted to speak with a MD ASAP. Scheduled pt with Dr. Janese Banks tomorrow 6/21.

## 2020-08-19 ENCOUNTER — Ambulatory Visit: Payer: Medicare HMO | Admitting: Oncology

## 2020-08-19 ENCOUNTER — Inpatient Hospital Stay (HOSPITAL_BASED_OUTPATIENT_CLINIC_OR_DEPARTMENT_OTHER): Payer: Medicare HMO | Admitting: Oncology

## 2020-08-19 ENCOUNTER — Encounter: Payer: Self-pay | Admitting: Oncology

## 2020-08-19 ENCOUNTER — Other Ambulatory Visit: Payer: Self-pay

## 2020-08-19 VITALS — BP 129/89 | HR 89 | Temp 98.0°F | Resp 16 | Ht 70.0 in | Wt 168.7 lb

## 2020-08-19 DIAGNOSIS — C73 Malignant neoplasm of thyroid gland: Secondary | ICD-10-CM | POA: Diagnosis not present

## 2020-08-19 NOTE — Progress Notes (Signed)
Pt states that he knows the cancer is getting worse. He has been taking maple syrup with baking soda and it makes his body acidic. He had taken caprelsa ( vandetanib) in the past and that has worked for him.  I told the pt tha the has a scan for 7/1 and he says he needs to start the med above and then he will get scans to see if it works.

## 2020-08-20 ENCOUNTER — Other Ambulatory Visit: Payer: Self-pay | Admitting: *Deleted

## 2020-08-20 DIAGNOSIS — C73 Malignant neoplasm of thyroid gland: Secondary | ICD-10-CM

## 2020-08-20 NOTE — Progress Notes (Signed)
Hematology/Oncology Consult note [facility]  [phone] [phone]  Patient Care Team: Leonel Ramsay, MD as PCP - General (Infectious Diseases) Sindy Guadeloupe, MD as Consulting Physician (Oncology)   Name of the patient: Grant Freeman  [id number]  1934-04-07   Date of visit: 08/20/20  Diagnosis: metastatic medullary carcinoma of the thyroid  Chief complaint/ Reason for visit- routine f/u of medullary thyroid carcinoma  Heme/Onc history: Patient is a 86 year old male diagnosed with stage IV medullary thyroid cancer.  He underwent bilateral thyroidectomy in May 2002.  Pathology showed 5.2 cm medullary carcinoma.  Cancer extended up to but not through the thyroid capsule.  Right sentinel lymph node was negative.  Left central neck node was positive for small focus of metastatic carcinoma.  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  Variants of uncertain significance included: ALK c.1184G>A (p.Arg395His) and NF1 c.5288A>T (p.Tyr1763Phe).  He received CarboTaxol chemotherapy back in October 2003.  Recurrent disease in 2006 and underwent right neck dissection.  4.2 cm soft tissue mass showing recurrent malignancy.  4 right-sided neck nodes were positive for malignancy with extracapsular extension.  He received DPIC back then with improvement of serum markers in 2010.  Additional neck dissection in 2012 for recurrent disease.  He received vandetanib between April 2011 to March 2012.  Then he did not receive any treatment following that.  PET scan in September 2021 showed low-level metabolic in the left thyroid cartilage.  1.9 cm retrotracheal soft tissue nodule concerning for metastatic disease.  9 mm right upper lobe nodule increased from 4 mm as compared to 9 years ago.  New 5 mm right lower lobe lung nodule since 2012.  Most recently patient had a  CT soft tissue neck and CT chest in February 32 86 year old which showed bilateral pulmonary nodules minimally increased since September 2021.  Stable 2 cm soft tissue mass in the right tracheoesophageal groove.  3 hypodense lesions in the liver could not exclude liver metastases.  Serum tumor markers 84.2 in June 2022 mildly increased as compared to 77 8 months prior.  Calcitonin level in January 2022 was 4100 as compared to 2000 in February 2020.  Interval history-patient desperately wants to restart treatment for his medullary thyroid cancer at this time.  This was offered by Dr. Mike Gip in February 2022 but he did not follow-up subsequently.  States that he is doing home-based remedy of maple syrup and baking soda and feels that that is also working for his thyroid cancer.  ECOG PS- 1 Pain scale- 0   Review of systems- Review of Systems  Constitutional:  Positive for malaise/fatigue. Negative for chills, fever and weight loss.  HENT:  Negative for congestion, ear discharge and nosebleeds.   Eyes:  Negative for blurred vision.  Respiratory:  Negative for cough, hemoptysis, sputum production, shortness of breath and wheezing.   Cardiovascular:  Negative for chest pain, palpitations, orthopnea and claudication.  Gastrointestinal:  Negative for abdominal pain, blood in stool, constipation, diarrhea, heartburn, melena, nausea and vomiting.  Genitourinary:  Negative for dysuria, flank pain, frequency, hematuria and urgency.  Musculoskeletal:  Negative for back pain, joint pain and myalgias.  Skin:  Negative for rash.  Neurological:  Negative for dizziness, tingling, focal weakness, seizures, weakness and headaches.  Endo/Heme/Allergies:  Does not bruise/bleed easily.  Psychiatric/Behavioral:  Negative for depression and suicidal ideas. The patient does not have insomnia.  Allergies  Allergen Reactions   Penicillins Hives     Past Medical History:  Diagnosis Date   Arthritis     Atrial fibrillation (HCC)    GERD (gastroesophageal reflux disease)    h/o   Hypothyroidism    Thyroid cancer, medullary carcinoma (HCC)    Thyroid cancer, medullary carcinoma (HCC)      Past Surgical History:  Procedure Laterality Date   COLONOSCOPY     RECTAL EXAM UNDER ANESTHESIA N/A 04/15/2020   Procedure: RECTAL EXAM UNDER ANESTHESIA WITH REMOVAL OF RECTAL POLYP;  Surgeon: Benjamine Sprague, DO;  Location: [facility] ORS;  Service: General;  Laterality: N/A;   THYROIDECTOMY      Social History   Socioeconomic History   Marital status: Single    Spouse name: Not on file   Number of children: Not on file   Years of education: Not on file   Highest education level: Not on file  Occupational History   Not on file  Tobacco Use   Smoking status: Never   Smokeless tobacco: Former  Scientific laboratory technician Use: Never used  Substance and Sexual Activity   Alcohol use: Yes    Alcohol/week: 2.0 standard drinks    Types: 2 Cans of beer per week    Comment: occ   Drug use: No   Sexual activity: Not Currently  Other Topics Concern   Not on file  Social History Narrative   Not on file   Social Determinants of Health   Financial Resource Strain: Not on file  Food Insecurity: Not on file  Transportation Needs: Not on file  Physical Activity: Not on file  Stress: Not on file  Social Connections: Not on file  Intimate Partner Violence: Not on file    History reviewed. No pertinent family history.   Current Outpatient Medications:    HYDROcodone-acetaminophen (NORCO) 5-325 MG tablet, Take 1 tablet by mouth every 6 (six) hours as needed for up to 6 doses for moderate pain., Disp: 6 tablet, Rfl: 0   ibuprofen (ADVIL) 400 MG tablet, Take 1 tablet (400 mg total) by mouth every 8 (eight) hours as needed for mild pain or moderate pain., Disp: 30 tablet, Rfl: 0   levothyroxine (SYNTHROID, LEVOTHROID) 112 MCG tablet, Take 112 mcg by mouth daily before breakfast., Disp: , Rfl:    Melatonin 10 MG  CAPS, Take 20 mg by mouth at bedtime., Disp: , Rfl:   Physical exam:  Vitals:   08/19/20 1442  BP: 129/89  Pulse: 89  Resp: 16  Temp: 98 F (36.7 C)  TempSrc: Oral  Weight: 168 lb 10.4 oz (76.5 kg)  Height: 5' 10"  (1.778 m)   Physical Exam HENT:     Head: Normocephalic and atraumatic.  Eyes:     Pupils: Pupils are equal, round, and reactive to light.  Cardiovascular:     Rate and Rhythm: Normal rate. Rhythm irregular.     Heart sounds: Normal heart sounds.  Pulmonary:     Effort: Pulmonary effort is normal.     Breath sounds: Normal breath sounds.  Abdominal:     General: Bowel sounds are normal.     Palpations: Abdomen is soft.  Musculoskeletal:     Cervical back: Normal range of motion.  Lymphadenopathy:     Comments: No palpable cervical or supraclavicular adenopathy  Skin:    General: Skin is warm and dry.  Neurological:     Mental Status: He is alert and oriented to person, place,  and time.     CMP Latest Ref Rng & Units 08/11/2020  Glucose 70 - 99 mg/dL 99  BUN 8 - 23 mg/dL 13  Creatinine 0.61 - 1.24 mg/dL 0.74  Sodium 135 - 145 mmol/L 136  Potassium 3.5 - 5.1 mmol/L 4.5  Chloride 98 - 111 mmol/L 104  CO2 22 - 32 mmol/L 25  Calcium 8.9 - 10.3 mg/dL 9.1  Total Protein 6.5 - 8.1 g/dL 8.0  Total Bilirubin 0.3 - 1.2 mg/dL 1.1  Alkaline Phos 38 - 126 U/L 45  AST 15 - 41 U/L 28  ALT 0 - 44 U/L 26   CBC Latest Ref Rng & Units 08/11/2020  WBC 4.0 - 10.5 K/uL 5.4  Hemoglobin 13.0 - 17.0 g/dL 14.0  Hematocrit 39.0 - 52.0 % 40.8  Platelets 150 - 400 K/uL 270      Assessment and plan- Patient is a 86 y.o. male with history of metastatic medullary carcinoma of the thyroid here for routine follow-up  Discussed with the patient that I would need to get repeat CT soft tissue neck chest abdomen pelvis with contrast to a certain his present extent of disease.  If there is more than 20% progression as compared to his prior scan in September 2021 we could consider  initiating vandetanib at that time.  Specific  RET inhibitors such as pralsetinib or selpercatinib have better response rates in medullary thyroid cancer with better toxicity profile in those patients who have somatic or germline RET mutations.  Patient did not have a germline mutation.  His last supraclavicular lymph node biopsy did not have enough tumor specimen to check for these mutations.  I will see him back after CT scan results are back to discuss further management.  We will also check CEA and calcitonin on that day   Visit Diagnosis 1. Thyroid cancer, medullary carcinoma ([place])      Dr. Randa Evens, MD, MPH [facility] at [facility] [phone] 08/20/2020 11:14 AM

## 2020-08-28 ENCOUNTER — Other Ambulatory Visit: Payer: Self-pay

## 2020-08-28 ENCOUNTER — Ambulatory Visit
Admission: RE | Admit: 2020-08-28 | Discharge: 2020-08-28 | Disposition: A | Discharge status: Home / Self Care | Payer: Medicare HMO | Source: Ambulatory Visit | Attending: Oncology | Admitting: Oncology

## 2020-08-28 ENCOUNTER — Ambulatory Visit: Payer: Medicare HMO

## 2020-08-28 DIAGNOSIS — I700 Atherosclerosis of aorta: Secondary | ICD-10-CM | POA: Insufficient documentation

## 2020-08-28 DIAGNOSIS — C73 Malignant neoplasm of thyroid gland: Secondary | ICD-10-CM | POA: Diagnosis not present

## 2020-08-28 DIAGNOSIS — K769 Liver disease, unspecified: Secondary | ICD-10-CM | POA: Diagnosis not present

## 2020-08-28 DIAGNOSIS — I712 Thoracic aortic aneurysm, without rupture: Secondary | ICD-10-CM | POA: Insufficient documentation

## 2020-08-28 DIAGNOSIS — K4090 Unilateral inguinal hernia, without obstruction or gangrene, not specified as recurrent: Secondary | ICD-10-CM | POA: Diagnosis not present

## 2020-08-28 DIAGNOSIS — R918 Other nonspecific abnormal finding of lung field: Secondary | ICD-10-CM | POA: Insufficient documentation

## 2020-08-28 MED ORDER — IOHEXOL 300 MG/ML  SOLN
100.0000 mL | Freq: Once | INTRAMUSCULAR | Status: AC | PRN
  Administered 2020-08-28: 100 mL via INTRAVENOUS

## 2020-09-02 ENCOUNTER — Inpatient Hospital Stay: Payer: Medicare HMO | Attending: Oncology | Admitting: Oncology

## 2020-09-02 ENCOUNTER — Other Ambulatory Visit: Payer: Self-pay

## 2020-09-02 ENCOUNTER — Encounter: Payer: Self-pay | Admitting: Oncology

## 2020-09-02 VITALS — BP 134/88 | HR 104 | Temp 97.0°F | Resp 20 | Wt 167.3 lb

## 2020-09-02 DIAGNOSIS — C770 Secondary and unspecified malignant neoplasm of lymph nodes of head, face and neck: Secondary | ICD-10-CM | POA: Insufficient documentation

## 2020-09-02 DIAGNOSIS — I4891 Unspecified atrial fibrillation: Secondary | ICD-10-CM | POA: Diagnosis not present

## 2020-09-02 DIAGNOSIS — C73 Malignant neoplasm of thyroid gland: Secondary | ICD-10-CM

## 2020-09-02 DIAGNOSIS — M542 Cervicalgia: Secondary | ICD-10-CM | POA: Insufficient documentation

## 2020-09-02 DIAGNOSIS — Z79899 Other long term (current) drug therapy: Secondary | ICD-10-CM | POA: Diagnosis not present

## 2020-09-02 DIAGNOSIS — R970 Elevated carcinoembryonic antigen [CEA]: Secondary | ICD-10-CM | POA: Diagnosis not present

## 2020-09-02 DIAGNOSIS — Z9119 Patient's noncompliance with other medical treatment and regimen: Secondary | ICD-10-CM | POA: Insufficient documentation

## 2020-09-02 NOTE — Progress Notes (Signed)
Hematology/Oncology Consult note [facility]  [phone] [phone]  Patient Care Team: Leonel Ramsay, MD as PCP - General (Infectious Diseases) Sindy Guadeloupe, MD as Consulting Physician (Oncology)   Name of the patient: Grant Freeman  [id number]  Nov 22, 1934   Date of visit: 09/02/20  Diagnosis- metastatic medullary carcinoma of the thyroid  Chief complaint/ Reason for visit-discuss CT scan results and further management  Heme/Onc history:  Patient is a 86 year old male diagnosed with stage IV medullary thyroid cancer.  He underwent bilateral thyroidectomy in May 2002.  Pathology showed 5.2 cm medullary carcinoma.  Cancer extended up to but not through the thyroid capsule.  Right sentinel lymph node was negative.  Left central neck node was positive for small focus of metastatic carcinoma.    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  Variants of uncertain significance included: ALK c.1184G>A (p.Arg395His) and NF1 c.5288A>T (p.Tyr1763Phe).    He received CarboTaxol chemotherapy back in October 2003.  Recurrent disease in 2006 and underwent right neck dissection.  4.2 cm soft tissue mass showing recurrent malignancy.  4 right-sided neck nodes were positive for malignancy with extracapsular extension.  He received DTIC back then with improvement of serum markers in 2010.  Additional neck dissection in 2012 for recurrent disease.  He received vandetanib between April 2011 to March 2012.  Treatment interrupted in November 2011 due to arm infection.  Back then dose was reduced from 300 mg to 200 mg.  Then he did not receive any treatment following that.  Right supraclavicular biopsy on 12/11/2019 confirmed metastatic carcinoma.  IHC for calcitonin was diffuse and strong staining confirming the diagnosis of metastatic medullary carcinoma of the  thyroid.     PET scan in September 2021 showed low-level metabolic in the left thyroid cartilage.  1.9 cm retrotracheal soft tissue nodule concerning for metastatic disease.  9 mm right upper lobe nodule increased from 4 mm as compared to 9 years ago.  New 5 mm right lower lobe lung nodule since 2012.  Most recently patient had a CT soft tissue neck and CT chest in February 17 86 year old which showed bilateral pulmonary nodules minimally increased since September 2021.  Stable 2 cm soft tissue mass in the right tracheoesophageal groove.  3 hypodense lesions in the liver could not exclude liver metastases.   Serum tumor markers 84.2 in June 2022 mildly increased as compared to 77 8 months prior.  Calcitonin level in January 2022 was 4100 as compared to 2000 in February 2020.   Interval history-patient is upset and angry today and demands to start vandetanib as soon as possible.  He reports pain over his neck and feels that the pain is coming from his malignancy.  States that his disease has been under control because of his home remedy using maple syrup.  He feels that once he stops the maple syrup his disease will be uncontrolled again.  ECOG PS- 1 Pain scale- 4   Review of systems- Review of Systems  Constitutional:  Negative for chills, fever, malaise/fatigue and weight loss.  HENT:  Negative for congestion, ear discharge and nosebleeds.        Neck pain  Eyes:  Negative for blurred vision.  Respiratory:  Negative for cough, hemoptysis, sputum production, shortness of breath and wheezing.   Cardiovascular:  Negative for chest pain, palpitations, orthopnea and claudication.  Gastrointestinal:  Negative for  abdominal pain, blood in stool, constipation, diarrhea, heartburn, melena, nausea and vomiting.  Genitourinary:  Negative for dysuria, flank pain, frequency, hematuria and urgency.  Musculoskeletal:  Negative for back pain, joint pain and myalgias.  Skin:  Negative for rash.   Neurological:  Negative for dizziness, tingling, focal weakness, seizures, weakness and headaches.  Endo/Heme/Allergies:  Does not bruise/bleed easily.  Psychiatric/Behavioral:  Negative for depression and suicidal ideas. The patient does not have insomnia.       Allergies  Allergen Reactions   Penicillins Hives     Past Medical History:  Diagnosis Date   Arthritis    Atrial fibrillation (HCC)    GERD (gastroesophageal reflux disease)    h/o   Hypothyroidism    Thyroid cancer, medullary carcinoma (HCC)    Thyroid cancer, medullary carcinoma (HCC)      Past Surgical History:  Procedure Laterality Date   COLONOSCOPY     RECTAL EXAM UNDER ANESTHESIA N/A 04/15/2020   Procedure: RECTAL EXAM UNDER ANESTHESIA WITH REMOVAL OF RECTAL POLYP;  Surgeon: Benjamine Sprague, DO;  Location: [facility] ORS;  Service: General;  Laterality: N/A;   THYROIDECTOMY      Social History   Socioeconomic History   Marital status: Single    Spouse name: Not on file   Number of children: Not on file   Years of education: Not on file   Highest education level: Not on file  Occupational History   Not on file  Tobacco Use   Smoking status: Never   Smokeless tobacco: Former  Scientific laboratory technician Use: Never used  Substance and Sexual Activity   Alcohol use: Yes    Alcohol/week: 2.0 standard drinks    Types: 2 Cans of beer per week    Comment: occ   Drug use: No   Sexual activity: Not Currently  Other Topics Concern   Not on file  Social History Narrative   Not on file   Social Determinants of Health   Financial Resource Strain: Not on file  Food Insecurity: Not on file  Transportation Needs: Not on file  Physical Activity: Not on file  Stress: Not on file  Social Connections: Not on file  Intimate Partner Violence: Not on file    History reviewed. No pertinent family history.   Current Outpatient Medications:    levothyroxine (SYNTHROID, LEVOTHROID) 112 MCG tablet, Take 112 mcg by mouth  daily before breakfast., Disp: , Rfl:    Melatonin 10 MG CAPS, Take 20 mg by mouth at bedtime., Disp: , Rfl:    HYDROcodone-acetaminophen (NORCO) 5-325 MG tablet, Take 1 tablet by mouth every 6 (six) hours as needed for up to 6 doses for moderate pain. (Patient not taking: Reported on 09/02/2020), Disp: 6 tablet, Rfl: 0   ibuprofen (ADVIL) 400 MG tablet, Take 1 tablet (400 mg total) by mouth every 8 (eight) hours as needed for mild pain or moderate pain. (Patient not taking: Reported on 09/02/2020), Disp: 30 tablet, Rfl: 0  Physical exam:  Vitals:   09/02/20 1025  BP: 134/88  Pulse: (!) 104  Resp: 20  Temp: (!) 97 F (36.1 C)  SpO2: 100%  Weight: 167 lb 5.3 oz (75.9 kg)   Physical Exam Neck:     Comments: Chronic scarring from prior surgeries and radiation treatments Cardiovascular:     Rate and Rhythm: Tachycardia present. Rhythm irregular.     Heart sounds: Normal heart sounds.  Pulmonary:     Effort: Pulmonary effort is normal.  Skin:    General: Skin is warm and dry.  Neurological:     Mental Status: He is alert and oriented to person, place, and time.     CMP Latest Ref Rng & Units 08/11/2020  Glucose 70 - 99 mg/dL 99  BUN 8 - 23 mg/dL 13  Creatinine 0.61 - 1.24 mg/dL 0.74  Sodium 135 - 145 mmol/L 136  Potassium 3.5 - 5.1 mmol/L 4.5  Chloride 98 - 111 mmol/L 104  CO2 22 - 32 mmol/L 25  Calcium 8.9 - 10.3 mg/dL 9.1  Total Protein 6.5 - 8.1 g/dL 8.0  Total Bilirubin 0.3 - 1.2 mg/dL 1.1  Alkaline Phos 38 - 126 U/L 45  AST 15 - 41 U/L 28  ALT 0 - 44 U/L 26   CBC Latest Ref Rng & Units 08/11/2020  WBC 4.0 - 10.5 K/uL 5.4  Hemoglobin 13.0 - 17.0 g/dL 14.0  Hematocrit 39.0 - 52.0 % 40.8  Platelets 150 - 400 K/uL 270    No images are attached to the encounter.  CT SOFT TISSUE NECK W CONTRAST  Addendum Date: 09/02/2020   ADDENDUM REPORT: 09/02/2020 07:52 ADDENDUM: Request made to compare with PET CT from November 21, 2019. Allowing for cross modality differences, there  has been no apparent change in the neck. Electronically Signed   By: Monte Fantasia M.D.   On: 09/02/2020 07:52   Result Date: 09/02/2020 CLINICAL DATA:  Follow-up thyroid cancer EXAM: CT NECK WITH CONTRAST TECHNIQUE: Multidetector CT imaging of the neck was performed using the standard protocol following the bolus administration of intravenous contrast. 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 Salivary glands: No inflammation, mass, or stone. Thyroid: Postoperative thyroid gland. Upper isthmic region nodule measuring 18 x 11 mm, non progressed. There are 2 discrete right lower pole region nodules which are contiguous and not progressed in size with the larger and lower nodule measuring up to 21 mm. Third non contiguous nodule which could be an adjacent lymph node, measuring up to 10 mm in length. Lymph nodes: Possible thyroid region lymph node on the right is noted above. Rounded and notably enhancing right supraclavicular node measuring 11 mm, non progressed. Vascular: Presumed right IJ sacrifice.  Atheromatous plaque. Limited intracranial: No acute finding Visualized orbits: No acute finding Mastoids and visualized paranasal sinuses: Patchy opacification of paranasal sinuses. Skeleton: Generalized cervical spine degeneration. Upper chest: Reported separately IMPRESSION: No new or progressive finding. Stable cervical lymph nodes and thyroid bed nodularity. Electronically Signed: By: JMonte FantasiaM.D. On: 09/01/2020 06:53   CT CHEST ABDOMEN PELVIS W CONTRAST  Result Date: 08/31/2020 CLINICAL DATA:  Metastatic medullary thyroid cancer restaging EXAM: CT CHEST, ABDOMEN, AND PELVIS WITH CONTRAST TECHNIQUE: Multidetector CT imaging of the chest, abdomen and pelvis was performed following the standard protocol during bolus administration of intravenous contrast. CONTRAST:  1082mOMNIPAQUE IOHEXOL 300  MG/ML  SOLN COMPARISON:  Multiple exams, including 04/13/2020 and PET-CT of 11/21/2019 FINDINGS: CT CHEST FINDINGS Cardiovascular: Aortic and branch vessel atherosclerotic calcification. Mild ascending aortic aneurysm at 4.0 cm in diameter. Mediastinum/Nodes: Thyroidectomy. Right upper mediastinal lymph node or tumor deposit along the trachea esophageal groove, 1.9 cm in short axis on image 7 series 3, formerly the same by my measurements. Lungs/Pleura: Hyperdense right upper lobe pulmonary nodule 0.9 by 0.8 cm on image 50 2 series 5, formerly the same by my measurements. Perifissural 0.5 by 0.5 cm  right lower lobe nodule on image 87 series 5, stable. 3 by 4 mm left apical nodule on image 21 series 5, stable. 4 mm left lower lobe pulmonary nodule on image 74 series 5, stable. Several other tiny nodules in the 1-3 mm diameter range are similar to prior. No new or enlarging pulmonary nodule identified. Musculoskeletal: Thoracic spondylosis. Mild bony demineralization leading to a somewhat heterogeneous appearance of the marrow for example in the spine. CT ABDOMEN PELVIS FINDINGS Hepatobiliary: 1.2 by 0.8 cm hypodense lesion segment 2 of the liver, stable and probably a cyst. 0.7 cm focus of enhancement in segment 4a of the liver on image 46 of series 3, previously measuring about 0.8 cm on 04/13/2020. 0.9 by 0.7 cm focal enhancing lesion in the lateral segment left hepatic lobe on image 49 series 3, previously 1.0 by 0.7 cm. Potential 0.5 cm enhancing lesion inferiorly in the right hepatic lobe on image 70 series 3, level not included on prior chest CT. Additional tiny enhancing lesions in the right hepatic lobe for example on images 53-54 of series 3, similar to prior. Contracted gallbladder.  No biliary dilatation. Pancreas: Unremarkable Spleen: Unremarkable Adrenals/Urinary Tract: Unremarkable Stomach/Bowel: Scattered sigmoid colon diverticula. Vascular/Lymphatic: Aortoiliac atherosclerotic vascular disease.  Reproductive: Borderline prostatomegaly. Other: No supplemental non-categorized findings. Musculoskeletal: Mild bony demineralization. Mild lumbar spondylosis and degenerative disc disease. Small direct left inguinal hernia contains adipose tissue. IMPRESSION: 1. Stable appearance of the soft tissue nodule in the right upper paratracheal region. This is adjacent to clips from thyroidectomy. 2. A few scattered pulmonary nodules are stable, largest the 9 by 8 mm hyperdense (and potentially calcified) right upper lobe nodule. No new lung nodules. 3. Stable small enhancing lesions in the liver. These are indeterminate with regard to benign or malignant etiology. If clinically warranted, hepatic protocol MRI with and without contrast could be utilized for further characterization. 4. Ascending aortic aneurysm 4.0 cm in diameter. Recommend annual imaging followup by CTA or MRA. This recommendation follows 2010 ACCF/AHA/AATS/ACR/ASA/SCA/SCAI/SIR/STS/SVM Guidelines for the Diagnosis and Management of Patients with Thoracic Aortic Disease. Circulation. 2010; 121: T248-L859. Aortic aneurysm NOS (ICD10-I71.9) 5. Other imaging findings of potential clinical significance: Aortic Atherosclerosis (ICD10-I70.0). Mild bony demineralization. Lumbar spondylosis and degenerative disc disease. Small direct left inguinal hernia containing adipose tissue. Electronically Signed   By: Van Clines M.D.   On: 08/31/2020 10:26     Assessment and plan- Patient is a 86 y.o. male with history of metastatic medullary carcinoma of the thyroid here to discuss CT scan results and further management  Patient's last calcitonin was checked in January 2022 when it was increased to 4100 as compared to 2000 in February 2020.  Calcitonin levels have been ordered today and they are currently pending.  There has been a gradual increase in the CEA from 69-86 in the same timeframe as well.  CEA last month was 84.  I have reviewed CT soft tissue neck  chest and abdomen pelvis images independently.  I had also asked radiology to comment on disease progression between now and his prior PET scan in September 2021.  Presently patient has low-volume disease mostly upto 2 cm.  There are scattered lung nodules which had increased in size in September 2021 as compared to 2018 but no significant increase in size since September 2021.  Upper ischemic thyroid nodule 18 x 11 mm and not progressed since September 2021.  Although patient's tumor markers are trending up there has been no significant radiological progression (<20%) in the  last 10 months with tumors up to 2 cm.  I would therefore favor holding vandetanib at this time and continue to monitor his tumor markers and scans every 6 months.  Specific ret inhibitors such as pralsetinib or selpercatinib have better response rates and better toxicity profile as compared to vandetanib if patients are noted to have RET mutations germline or somatic.  Patient does not have a germline RET mutation and not enough tissue to check somatic mutation based on his prior supraclavicular lymph node biopsy.  Vandetanib is associated with significant toxicities such as Diarrhea skin rash hypertension abdominal pain and electrolyte disturbances as well as QT prolongation.  Patient is upset and adamant about starting vandetanib as soon as possible.  He is not happy with my care and I therefore offered him to see an alternate provider in our practice or go for a second opinion to [place].  Patient has been noncompliant in the past and if consideration for vandetanib is given he would also need possible cardiology clearance given his prior history of A. fib.  Last TSH 3 weeks ago was normal  I feel that his neck pain is secondary to his chronic surgeries in the past.  Difficult to attribute pain to Thyroid nodules which are fairly small and no significantly different as compared to September.  I will defer further management to Dr.  Rogue Bussing as he will be taking over this patient's care in 2 weeks.    Visit Diagnosis 1. Thyroid cancer, medullary carcinoma ([place])      Dr. Randa Evens, MD, MPH [facility] at [facility] [phone] 09/02/2020 1:24 PM

## 2020-09-03 LAB — CEA: CEA: 86.2 ng/mL — ABNORMAL HIGH (ref 0.0–4.7)

## 2020-09-04 LAB — CALCITONIN: Calcitonin: 4303 pg/mL — ABNORMAL HIGH (ref 0.0–8.4)

## 2020-09-08 ENCOUNTER — Other Ambulatory Visit: Payer: Self-pay

## 2020-09-08 ENCOUNTER — Inpatient Hospital Stay: Payer: Medicare HMO | Admitting: Internal Medicine

## 2020-09-08 DIAGNOSIS — C73 Malignant neoplasm of thyroid gland: Secondary | ICD-10-CM | POA: Diagnosis not present

## 2020-09-08 NOTE — Progress Notes (Signed)
[facility] NOTE  Patient Care Team: Leonel Ramsay, MD as PCP - General (Infectious Diseases) Cammie Sickle, MD as Consulting Physician (Internal Medicine)  CHIEF COMPLAINTS/PURPOSE OF CONSULTATION: Medullary carcinoma thyroid  #  Oncology History Overview Note  Overview:  Status post total thyroidectomy, October 2007 here at [facility] and resection of cervical lymph nodes. 1. Locally relapsed medullary thyroid cancer in soft tissues of the left neck  base. Based on outside PET scan from 02/2010 showing increased FDG uptake  in left base of neck. He also has an elevated calcitonin in the 2700 range  in 02/2010. t is important to note that six years earlier in 2006 it was  2300.  A. 07/06/2000 bilateral thyroidectomy right lobe contained a 5.2 cm  medullary carcinoma, cancer extended up to but not through the thyroid  capsule on the anterior and posterior surfaces. Left lobe thyroidectomy  showed nodular goiter with no medullary carcinoma identified. A right  neck sentinel lymph node was negative for cancer. A left neck central  lymph node was positive for one small node with a focus of metastatic  medullary carcinoma. Two lymph nodes in the left upper mediastinum  were negative for cancer and a third paratracheal lymph node was  negative for cancer.  B. 10/04/2004 right neck dissection removed soft tissue mass with a 4.2  cm recurrent medullary thyroid cancer and 4/4 right neck lymph nodes.  The largest node 4.2 cm with extracapsular extension into the perinodal  soft tissue. Furthermore a right neck level IIB dissection showed  8/11 lymph nodes with positive for metastatic thyroid cancer. The  largest focus 0.5 cm with tumor present in perinodal lymphatics.  C. 11/2007 whole body PET CT mildly increased FDG in the left  supraclavicular lymph node worrisome for disease recurrence with a  questionable second lymph node above the left supraclavicular  node.  D.G. PET scan 03/11/2010 showing increased FDG in left base of neck soft  tissues with no distant metastases.  H. 11/2001 received chemotherapy using paclitaxel and carboplatin for  an unknown number of cycles.  I. 2010 received IV DTIC chemotherapy for metastatic disease in the soft  tissues of the left neck with some improvement in serum markers.  J. 05/2009 through 05/17/2010 Vandetanib initially 300 mg per day until  roughly 12/2009 when it was interrupted due to infections on arm. It  was interrupted for the month of December and then resumed in early  January, 2012 at 200 mg per day. Last dose administered 05/17/2010. K. 2012-2014 no TKI treatment, pt refusal of TKI and imaging, calcitonin remained in  875 range in feb 2014.  10/18/2012: PET/CT by FD review: NED except ambiquious RLL 5 mm nodule.    Thyroid cancer, medullary carcinoma ([place])  07/13/2011 Initial Diagnosis   Thyroid cancer, medullary carcinoma (HCC)      HISTORY OF PRESENTING ILLNESS:  Grant Freeman 86 y.o.  male patient with longstanding history of metastatic medullary carcinoma of the thyroid is here for a "second opinion".   I reviewed patient's significant/past medical history of metastatic medullary carcinoma thyroid/summarized above.  Patient was recently evaluated by my colleague Dr. Sheryle Spray imaging given rising tumor marker.  Given overall stability of disease on imaging-patient is recommended continued surveillance.  Patient dissatisfied with response-is here to seek another opinion.  Review of Systems  Constitutional:  Negative for chills, diaphoresis, fever, malaise/fatigue and weight loss.  HENT:  Negative for nosebleeds and sore throat.  Eyes:  Negative for double vision.  Respiratory:  Negative for cough, hemoptysis, sputum production, shortness of breath and wheezing.   Cardiovascular:  Negative for chest pain, palpitations, orthopnea and leg swelling.  Gastrointestinal:  Negative for  abdominal pain, blood in stool, constipation, diarrhea, heartburn, melena, nausea and vomiting.  Genitourinary:  Negative for dysuria, frequency and urgency.  Musculoskeletal:  Positive for back pain, joint pain and myalgias.  Skin: Negative.  Negative for itching and rash.  Neurological:  Negative for dizziness, tingling, focal weakness, weakness and headaches.  Endo/Heme/Allergies:  Does not bruise/bleed easily.  Psychiatric/Behavioral:  Negative for depression. The patient is not nervous/anxious and does not have insomnia.     MEDICAL HISTORY:  Past Medical History:  Diagnosis Date   Arthritis    Atrial fibrillation (HCC)    GERD (gastroesophageal reflux disease)    h/o   Hypothyroidism    Thyroid cancer, medullary carcinoma (HCC)    Thyroid cancer, medullary carcinoma (HCC)     SURGICAL HISTORY: Past Surgical History:  Procedure Laterality Date   COLONOSCOPY     RECTAL EXAM UNDER ANESTHESIA N/A 04/15/2020   Procedure: RECTAL EXAM UNDER ANESTHESIA WITH REMOVAL OF RECTAL POLYP;  Surgeon: Benjamine Sprague, DO;  Location: [facility] ORS;  Service: General;  Laterality: N/A;   THYROIDECTOMY      SOCIAL HISTORY: Social History   Socioeconomic History   Marital status: Single    Spouse name: Not on file   Number of children: Not on file   Years of education: Not on file   Highest education level: Not on file  Occupational History   Not on file  Tobacco Use   Smoking status: Never   Smokeless tobacco: Former  Scientific laboratory technician Use: Never used  Substance and Sexual Activity   Alcohol use: Yes    Alcohol/week: 2.0 standard drinks    Types: 2 Cans of beer per week    Comment: occ   Drug use: No   Sexual activity: Not Currently  Other Topics Concern   Not on file  Social History Narrative   Not on file   Social Determinants of Health   Financial Resource Strain: Not on file  Food Insecurity: Not on file  Transportation Needs: Not on file  Physical Activity: Not on file   Stress: Not on file  Social Connections: Not on file  Intimate Partner Violence: Not on file    FAMILY HISTORY: No family history on file.  ALLERGIES:  is allergic to penicillins.  MEDICATIONS:  Current Outpatient Medications  Medication Sig Dispense Refill   levothyroxine (SYNTHROID, LEVOTHROID) 112 MCG tablet Take 112 mcg by mouth daily before breakfast.     loratadine (CLARITIN) 10 MG tablet Take 10 mg by mouth daily.     Melatonin 10 MG CAPS Take 20 mg by mouth at bedtime.     HYDROcodone-acetaminophen (NORCO) 5-325 MG tablet Take 1 tablet by mouth every 6 (six) hours as needed for up to 6 doses for moderate pain. (Patient not taking: No sig reported) 6 tablet 0   ibuprofen (ADVIL) 400 MG tablet Take 1 tablet (400 mg total) by mouth every 8 (eight) hours as needed for mild pain or moderate pain. (Patient not taking: No sig reported) 30 tablet 0   No current facility-administered medications for this visit.      Marland Kitchen  PHYSICAL EXAMINATION: ECOG PERFORMANCE STATUS: 0 - Asymptomatic  Vitals:   09/08/20 1311  Resp: 18  Temp: 97.9 F (36.6  C)   Filed Weights   09/08/20 1311  Weight: 169 lb 15.6 oz (77.1 kg)    Physical Exam Vitals and nursing note reviewed.  Constitutional:      Comments: Ambulating: independently   Alone   HENT:     Head: Normocephalic and atraumatic.     Mouth/Throat:     Pharynx: Oropharynx is clear.  Eyes:     Extraocular Movements: Extraocular movements intact.     Pupils: Pupils are equal, round, and reactive to light.  Cardiovascular:     Rate and Rhythm: Normal rate and regular rhythm.  Pulmonary:     Comments: Decreased breath sounds bilaterally.  Abdominal:     Palpations: Abdomen is soft.  Musculoskeletal:        General: Normal range of motion.     Cervical back: Normal range of motion.  Skin:    General: Skin is warm.  Neurological:     General: No focal deficit present.     Mental Status: He is alert and oriented to person,  place, and time.  Psychiatric:        Behavior: Behavior normal.        Judgment: Judgment normal.     LABORATORY DATA:  I have reviewed the data as listed Lab Results  Component Value Date   WBC 5.4 08/11/2020   HGB 14.0 08/11/2020   HCT 40.8 08/11/2020   MCV 97.1 08/11/2020   PLT 270 08/11/2020   Recent Labs    11/25/19 1628 01/07/20 1424 04/02/20 1641 04/03/20 1703 08/11/20 1328  NA 136   < > 132* 131* 136  K 4.5   < > 4.1 4.0 4.5  CL 103   < > 98 99 104  CO2 24   < > 23 22 25   GLUCOSE 107*   < > 113* 117* 99  BUN 13   < > 12 9 13   CREATININE 0.83   < > 0.79 0.71 0.74  CALCIUM 9.2   < > 9.0 8.4* 9.1  GFRNONAA >60   < > >60 >60 >60  GFRAA >60  --   --   --   --   PROT 8.1   < > 7.7 7.3 8.0  ALBUMIN 4.6   < > 4.4 4.4 4.7  AST 33   < > 34 37 28  ALT 45*   < > 46* 45* 26  ALKPHOS 55   < > 50 51 45  BILITOT 1.1   < > 0.6 1.1 1.1   < > = values in this interval not displayed.    RADIOGRAPHIC STUDIES: I have personally reviewed the radiological images as listed and agreed with the findings in the report. No results found.  ASSESSMENT & PLAN:   Thyroid cancer, medullary carcinoma (HCC) #Metastatic/recurrent  medullary carcinoma of the thyroid; Patient does not have a germline RET mutation ; supraclavicular lymph node biopsy- NGS-QNS.   July 2022 CT scan-  Stable appearance of the soft tissue nodule in the right upperparatracheal region [adjacent to clips from thyroidectomy] ;  A few scattered pulmonary nodules are stable, largest the 9 by 8 mm hyperdense (and potentially calcified) right upper lobe nodule. No new lung nodules.Stable small enhancing lesions in the liver [see below].  Given the overall stability I would recommend continued surveillance; rather than treating with any of the available therapies.    #Overall radiologically-patient's medullary carcinoma thyroid is stable; however noticed increasing the tumor markers/CEA calcitonin.   # AAA-  Ascending  aortic aneurysm 4.0 cm in diameter-patient will need follow-up    # PLAN: Discussed that drugs have multiple side effects/and I would recommend treating only the lung nodules are getting worse/rather than just "treating elevated tumor marker".  Patient was upset with me; stating that he does not understand the reason why he is not being offered therapy.  I tried to explain given the risk versus benefits at this time given overall stability disease recommend monitoring.  I agree with Dr. Elroy Channel plan of continued surveillance.  Discussed with Dr. Janese Banks.  Patient was extremely dissatisfied with my response.  I even offered a second opinion at a tertiary care Institute; patient declines.  Patient left clinic without any further appointments-"threatening a lawsuit".  Patient will be discharged from the care.  # DISPOSITION: # No follow ups-Dr.B  # 40 minutes face-to-face with the patient discussing the above plan of care; more than 50% of time spent on prognosis/ natural history; counseling and coordination.  All questions were answered. The patient knows to call the clinic with any problems, questions or concerns.    Cammie Sickle, MD 10/04/2020 9:17 PM

## 2020-09-08 NOTE — Progress Notes (Signed)
Pt c/o throat pain 9/10 stopped by [facility] prior to appointment to purchase pain relief medication, pharmacist recommended Loratadine.

## 2020-09-08 NOTE — Assessment & Plan Note (Addendum)
#  Metastatic/recurrent  medullary carcinoma of the thyroid; Patient does not have a germline RET mutation ; supraclavicular lymph node biopsy- NGS-QNS.   July 2022 CT scan-  Stable appearance of the soft tissue nodule in the right upperparatracheal region [adjacent to clips from thyroidectomy] ;  A few scattered pulmonary nodules are stable, largest the 9 by 8 mm hyperdense (and potentially calcified) right upper lobe nodule. No new lung nodules.Stable small enhancing lesions in the liver [see below].  Given the overall stability I would recommend continued surveillance; rather than treating with any of the available therapies.    #Overall radiologically-patient's medullary carcinoma thyroid is stable; however noticed increasing the tumor markers/CEA calcitonin.   # AAA- Ascending aortic aneurysm 4.0 cm in diameter-patient will need follow-up    # PLAN: Discussed that drugs have multiple side effects/and I would recommend treating only the lung nodules are getting worse/rather than just "treating elevated tumor marker".  Patient was upset with me; stating that he does not understand the reason why he is not being offered therapy.  I tried to explain given the risk versus benefits at this time given overall stability disease recommend monitoring.  I agree with Dr. Elroy Channel plan of continued surveillance.  Discussed with Dr. Janese Banks.  Patient was extremely dissatisfied with my response.  I even offered a second opinion at a tertiary care Institute; patient declines.  Patient left clinic without any further appointments-"threatening a lawsuit".  Patient will be discharged from the care.  # DISPOSITION: # No follow ups-Dr.B  # 40 minutes face-to-face with the patient discussing the above plan of care; more than 50% of time spent on prognosis/ natural history; counseling and coordination.

## 2020-09-15 ENCOUNTER — Ambulatory Visit: Payer: Medicare HMO | Admitting: Internal Medicine

## 2021-07-29 NOTE — Telephone Encounter (Signed)
Signing encounter, see note on 04/01/20 

## 2021-09-20 NOTE — Telephone Encounter (Signed)
Signing encounter , see note 03/26/20

## 2022-07-06 IMAGING — MR MR PELVIS WO/W CM
7 of 8 series · 40 of 48 positions shown · IV contrast (gadavist)
Comparison: 11/21/2019 PET-CT.

CLINICAL DATA: Anal pain. Concern for anal fissure. Reported
history of benign perianal skin lesion biopsy in [REDACTED]. History
of metastatic medullary thyroid carcinoma.

EXAM:
MRI PELVIS WITHOUT AND WITH CONTRAST
TECHNIQUE: Multiplanar multisequence MR imaging of the pelvis was performed
both before and after administration of intravenous contrast.
CONTRAST:  7mL GADAVIST GADOBUTROL 1 MMOL/ML IV SOLN

[Series 2: T2 · sagittal · 5.0mm · 0.51mm/px · 4 of 25 slices shown (1 of 2)]
[im 1/25]
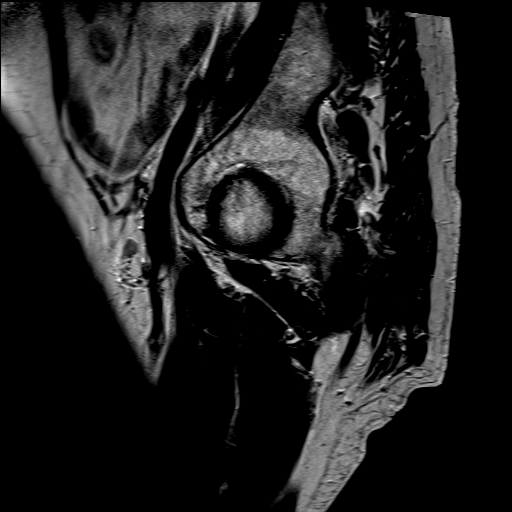
[im 9/25]
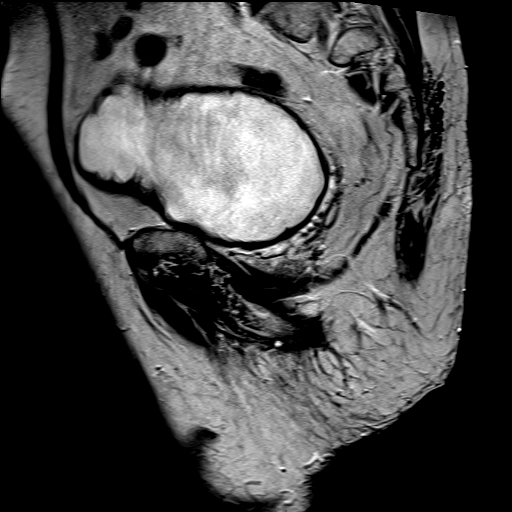
[im 17/25]
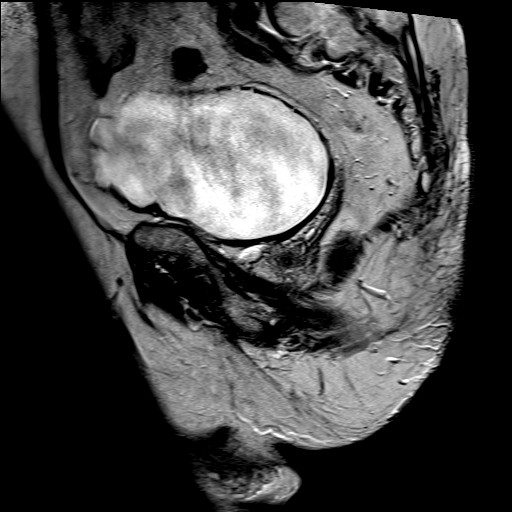
[im 25/25]
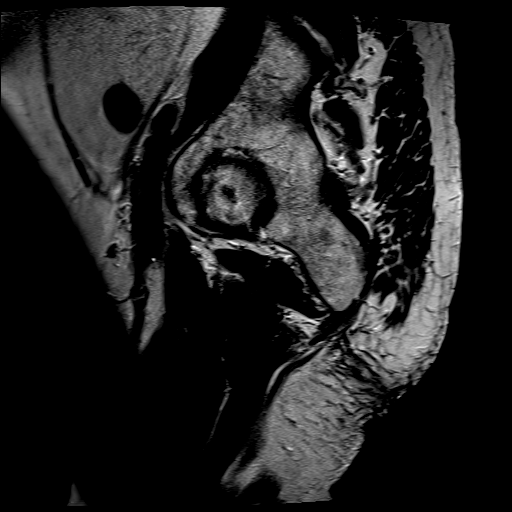

[Series 3: T2 fat-sat · sagittal · 5.0mm · 0.81mm/px · 4 of 25 slices shown (1 of 3)]
[im 1/25]
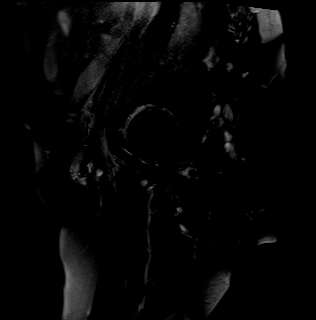
[im 9/25]
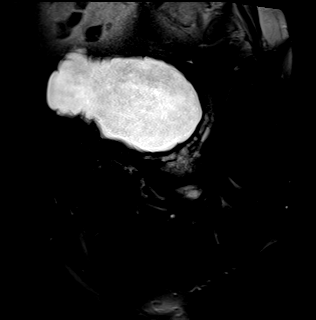
[im 17/25]
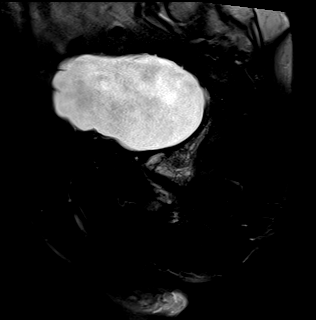
[im 25/25]
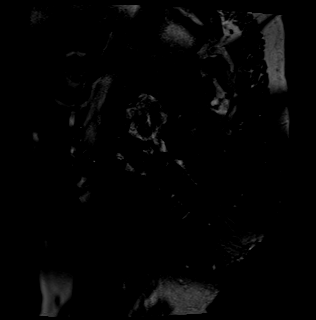

[Series 4: T2 fat-sat · coronal · 4.0mm · 0.75mm/px · 8 of 49 slices shown (2 of 3)]
[im 1/49]
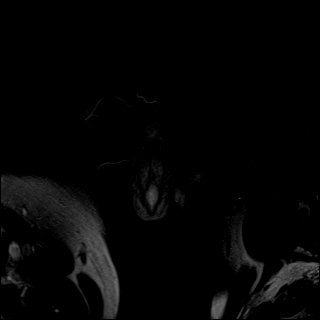
[im 7/49]
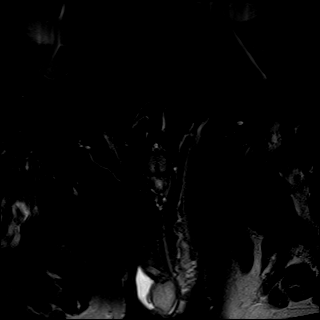
[im 14/49]
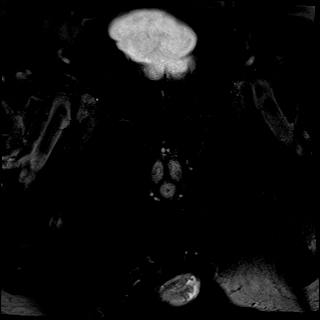
[im 21/49]
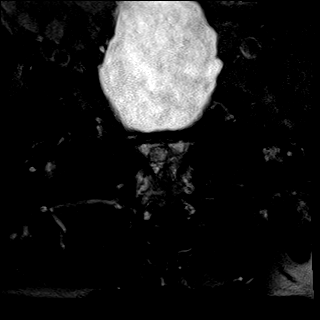
[im 28/49]
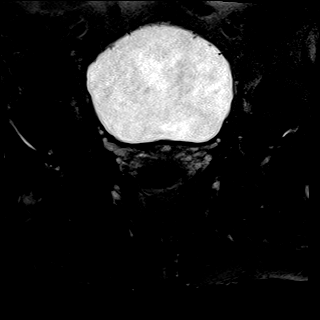
[im 35/49]
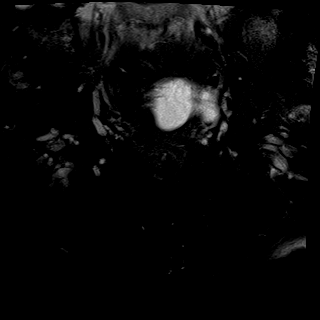
[im 42/49]
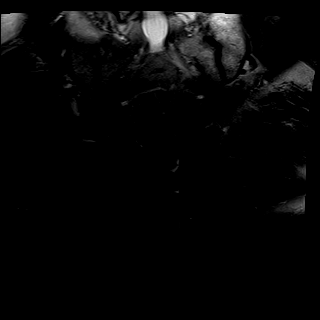
[im 49/49]
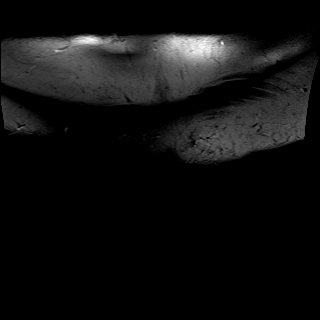

[Series 5: T2 · axial · 4.0mm · 0.88mm/px · z∈[-144,-3]mm · 6 of 36 slices shown (2 of 2)]
[im 1/36]
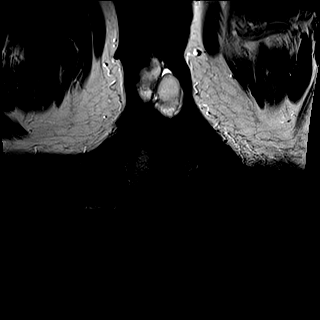
[im 8/36]
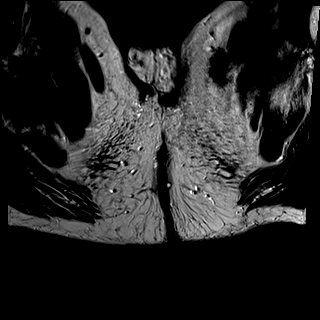
[im 15/36]
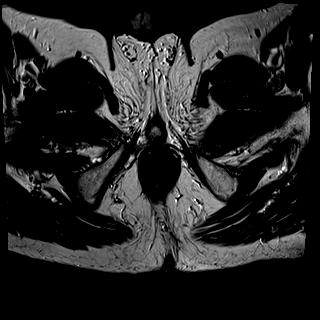
[im 22/36]
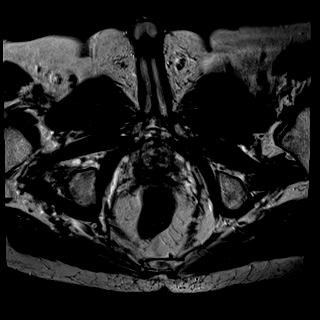
[im 29/36]
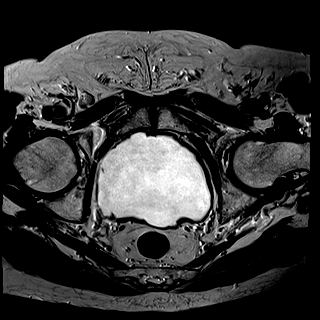
[im 36/36]
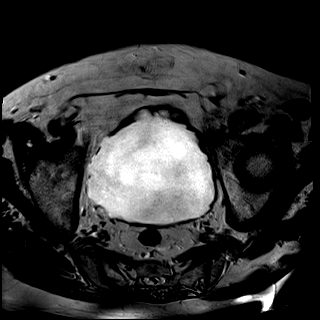

[Series 6: T2 fat-sat · axial · 4.0mm · 0.75mm/px · z∈[-134,+6]mm · 6 of 36 slices shown (3 of 3)]
[im 1/36]
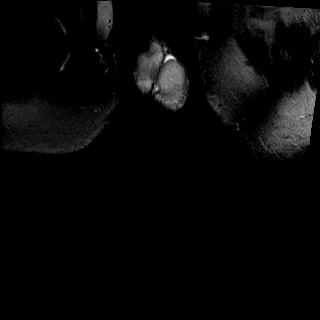
[im 8/36]
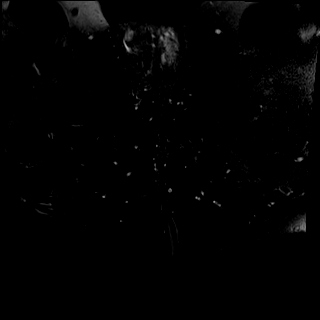
[im 15/36]
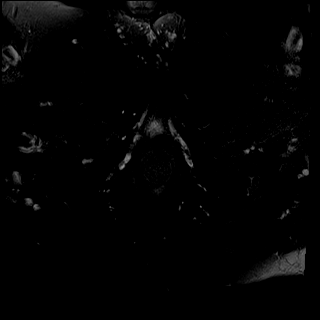
[im 22/36]
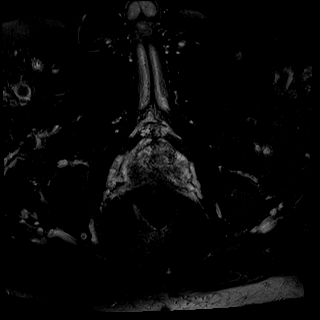
[im 29/36]
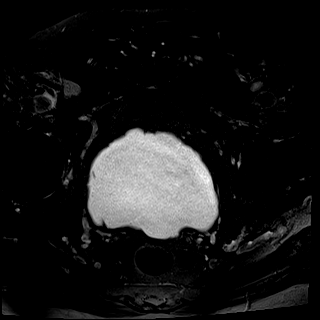
[im 36/36]
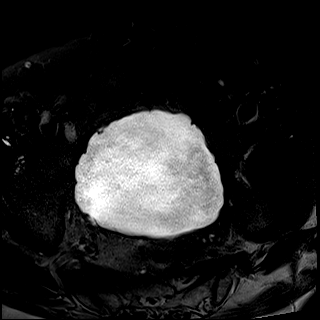

[Series 7: T1 · axial · 4.0mm · 0.81mm/px · z∈[-137,+4]mm · 6 of 36 slices shown]
[im 1/36]
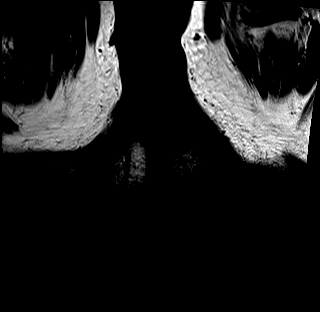
[im 8/36]
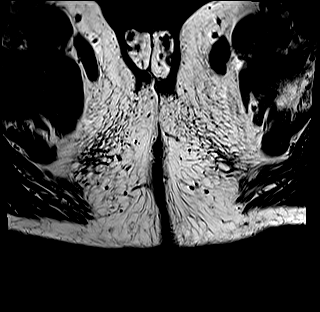
[im 15/36]
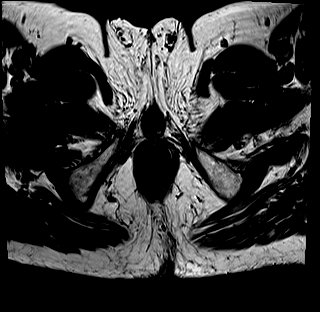
[im 22/36]
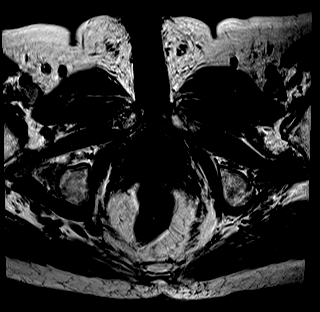
[im 29/36]
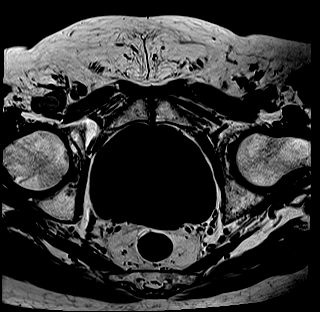
[im 36/36]
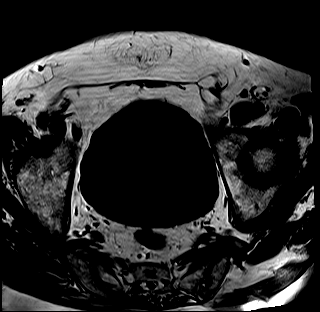

[Series 8: T1 fat-sat · axial · 4.0mm · 0.81mm/px · z∈[-137,+3]mm · 6 of 36 slices shown]
[im 1/36]
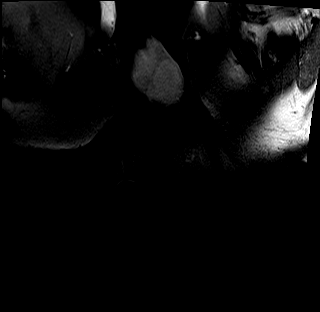
[im 8/36]
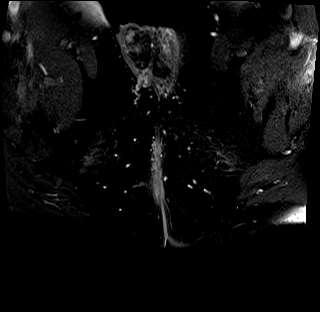
[im 15/36]
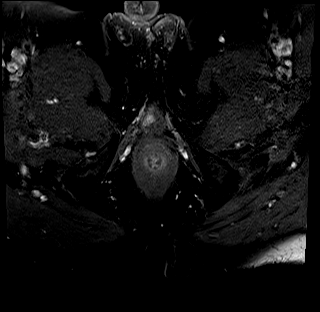
[im 22/36]
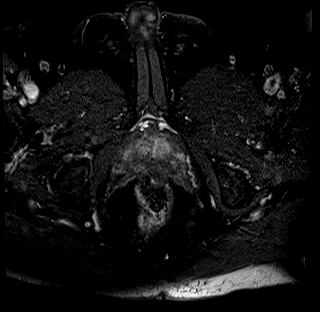
[im 29/36]
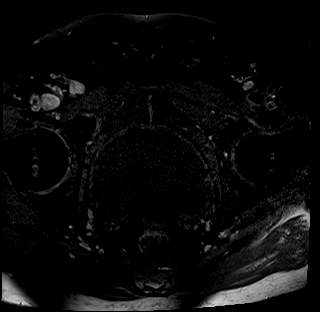
[im 36/36]
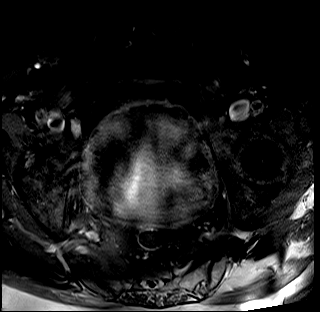

[40 of 48 positions shown; findings below may reference images not displayed]

FINDINGS: Urinary Tract: Mildly distended urinary bladder with mild diffuse
bladder wall thickening and trabeculation.

Bowel: There is an approximately 3 cm length curvilinear 6-7 o'clock
intersphincteric perianal fistula extending inferiorly to the medial
right gluteal cleft (series 8/image 25-27) with associated mild wall
thickening and enhancement. No perianal or perirectal abscess.

Vascular/Lymphatic: No acute vascular abnormality. No pathologically
enlarged lymph nodes in the visualized pelvis.

Reproductive: Small right hydrocele. Mildly enlarged prostate with
mild central gland hypertrophy.

Other:  No pelvic ascites.

Musculoskeletal: No aggressive appearing focal osseous lesions.
IMPRESSION: 1. Curvilinear 6-7 o'clock intersphincteric perianal fistula
extending inferiorly to the medial right gluteal cleft,
approximately 3 cm in length. No abscess.
2. Mildly distended urinary bladder with mild diffuse bladder wall
thickening and trabeculation, likely due to chronic bladder outlet
obstruction by the mildly enlarged prostate.
3. Small right hydrocele.

## 2023-04-13 ENCOUNTER — Emergency Department: Payer: 59

## 2023-04-13 ENCOUNTER — Encounter: Payer: Self-pay | Admitting: *Deleted

## 2023-04-13 ENCOUNTER — Other Ambulatory Visit: Payer: Self-pay

## 2023-04-13 ENCOUNTER — Emergency Department
Admission: EM | Admit: 2023-04-13 | Discharge: 2023-04-14 | Disposition: A | Discharge status: Home / Self Care | Payer: 59 | Attending: Emergency Medicine | Admitting: Emergency Medicine

## 2023-04-13 DIAGNOSIS — R911 Solitary pulmonary nodule: Secondary | ICD-10-CM | POA: Diagnosis not present

## 2023-04-13 DIAGNOSIS — M542 Cervicalgia: Secondary | ICD-10-CM | POA: Diagnosis present

## 2023-04-13 DIAGNOSIS — Z85850 Personal history of malignant neoplasm of thyroid: Secondary | ICD-10-CM | POA: Diagnosis not present

## 2023-04-13 DIAGNOSIS — E041 Nontoxic single thyroid nodule: Secondary | ICD-10-CM | POA: Diagnosis not present

## 2023-04-13 LAB — CBC
HCT: 44.6 % (ref 39.0–52.0)
Hemoglobin: 15.3 g/dL (ref 13.0–17.0)
MCH: 32 pg (ref 26.0–34.0)
MCHC: 34.3 g/dL (ref 30.0–36.0)
MCV: 93.3 fL (ref 80.0–100.0)
Platelets: 232 10*3/uL (ref 150–400)
RBC: 4.78 MIL/uL (ref 4.22–5.81)
RDW: 13.7 % (ref 11.5–15.5)
WBC: 5 10*3/uL (ref 4.0–10.5)
nRBC: 0 % (ref 0.0–0.2)

## 2023-04-13 LAB — BASIC METABOLIC PANEL
Anion gap: 9 (ref 5–15)
BUN: 10 mg/dL (ref 8–23)
CO2: 23 mmol/L (ref 22–32)
Calcium: 9.5 mg/dL (ref 8.9–10.3)
Chloride: 107 mmol/L (ref 98–111)
Creatinine, Ser: 0.76 mg/dL (ref 0.61–1.24)
GFR, Estimated: >60 mL/min (ref >60)
Glucose, Bld: 96 mg/dL (ref 70–99)
Potassium: 4.6 mmol/L (ref 3.5–5.1)
Sodium: 139 mmol/L (ref 135–145)

## 2023-04-13 MED ORDER — IOHEXOL 300 MG/ML  SOLN
75.0000 mL | Freq: Once | INTRAMUSCULAR | Status: AC | PRN
  Administered 2023-04-13: 75 mL via INTRAVENOUS

## 2023-04-13 NOTE — ED Provider Triage Note (Signed)
Emergency Medicine Provider Triage Evaluation Note  Grant Freeman , a 89 y.o. male  was evaluated in triage.  Pt complains of neck pain, hx thyroid cancer, sent by k[facility].  Review of Systems  Positive:   Negative:    Physical Exam  BP (!) 138/97 (BP Location: Left Arm)   Pulse (!) 105   Temp 97.9 F (36.6 C) (Oral)   Resp 20   SpO2 95%  Gen:   Awake, no distress    Resp:  Normal effort   MSK:   Moves extremities without difficulty   Other:     Medical Decision Making  Medically screening exam initiated at 6:14 PM.  Appropriate orders placed.  DAMIN SALIDO was informed that the remainder of the evaluation will be completed by another provider, this initial triage assessment does not replace that evaluation, and the importance of remaining in the ED until their evaluation is complete.     Faythe Ghee, PA-C 04/13/23 1815

## 2023-04-13 NOTE — ED Provider Notes (Signed)
[facility] Provider Note    Event Date/Time   First MD Initiated Contact with Patient 04/13/23 2315     (approximate)   History   Neck Pain   HPI  Grant Freeman is a 89 y.o. male with history of medullary thyroid cancer, atrial fibrillation, presenting with neck pain.  States that neck pain has been going on for a while.  He denies any chest pain or shortness of breath.  No trouble swallowing.  No other symptoms.  On independent chart review he was seen by primary care, he has stopped treatment because he did not feel like it was helping him.  Per the note he told [facility] that he will to be reevaluated for reinitiation of therapy but never went back to see them.  Patient states that he does not want to go back to [facility] since "they did not do anything for him".  Wants a referral to our oncologist here.     Physical Exam   Triage Vital Signs: ED Triage Vitals  Encounter Vitals Group     BP 04/13/23 1813 (!) 138/97     Systolic BP Percentile --      Diastolic BP Percentile --      Pulse Rate 04/13/23 1813 (!) 105     Resp 04/13/23 1813 20     Temp 04/13/23 1813 97.9 F (36.6 C)     Temp Source 04/13/23 1813 Oral     SpO2 04/13/23 1813 95 %     Weight 04/13/23 1814 175 lb (79.4 kg)     Height 04/13/23 1814 5\' 10"  (1.778 m)     Head Circumference --      Peak Flow --      Pain Score 04/13/23 1814 8     Pain Loc --      Pain Education --      Exclude from Growth Chart --     Most recent vital signs: Vitals:   04/13/23 1813  BP: (!) 138/97  Pulse: (!) 105  Resp: 20  Temp: 97.9 F (36.6 C)  SpO2: 95%     General: Awake, no distress.  CV:  Good peripheral perfusion.  Resp:  Normal effort.  Abd:  No distention.  Other:  Clear oropharynx without swelling.  There is scarring on his neck, very mildly tender to palpation, there is no fluctuance or induration, no overlying erythema.  ED Results / Procedures / Treatments    Labs (all labs ordered are listed, but only abnormal results are displayed) Labs Reviewed  BASIC METABOLIC PANEL  CBC     RADIOLOGY CT imaging on my interpretation, airways patent   PROCEDURES:  Critical Care performed: No  Procedures   MEDICATIONS ORDERED IN ED: Medications  iohexol (OMNIPAQUE) 300 MG/ML solution 75 mL (75 mLs Intravenous Contrast Given 04/13/23 2301)     IMPRESSION / MDM / ASSESSMENT AND PLAN / ED COURSE  I reviewed the triage vital signs and the nursing notes.                              Differential diagnosis includes, but is not limited to, progression of his cancer, chemo/radiation pain, patient is tolerating secretions, clear oropharynx, no respiratory distress, doubt mass is causing impingement of his airway.  CT as well as labs were done out of triage.  Patient's presentation is most consistent with acute presentation with potential threat to  life or bodily function.  Independent review of labs and imaging, CT showed similar versus increased thyroid nodule, also stable 9 mm pulmonary nodule.  No leukocytosis per labs, electrolytes not severely deranged, creatinine is normal.  Discussed CT imaging findings as well as lab findings with patient.  Discussed giving him a referral for oncology here and also number to call if he does not hear back from them.  Patient expressed understanding and said that he will follow-up.  Strict precautions given.  Considered but no indication for inpatient admission at this time, he is safe for outpatient management.  Will discharge.  Clinical Course as of 04/14/23 0059  Fri Apr 14, 2023  0021 CT Soft Tissue Neck W Contrast IMPRESSION: 1. Similar versus mildly increased in the dominant upper isthmic region thyroid nodule. Other nodules and lymph nodes are unchanged. 2. Stable 9 mm right upper lobe pulmonary nodule.   [TT]    Clinical Course User Index [TT] Jodie Echevaria Franchot Erichsen, MD     FINAL CLINICAL IMPRESSION(S) /  ED DIAGNOSES   Final diagnoses:  Thyroid nodule  Pulmonary nodule     Rx / DC Orders   ED Discharge Orders          Ordered    Ambulatory referral to Hematology / Oncology       Comments: Your emergency department provider has referred you to see a hematology/oncology specialist. These are physicians who specialize in blood disorders and cancers, or findings concerning for cancer. You will receive a phone call from the [facility] to set up your appointment within 2 business days: [employer] operate Mon - Fri, 8:00 a.m. to 5:00 p.m.; closed for federally recognized holidays. Please be sure your phone is not set to block numbers during this time.   04/14/23 0058             Note:  This document was prepared using Dragon voice recognition software and may include unintentional dictation errors.    Claybon Jabs, MD 04/14/23 [phone]

## 2023-04-13 NOTE — ED Triage Notes (Signed)
Pt ambulatory to triage.  Pt has thyroid cancer and is having neck pain for 1 day.  No n/v  denies resp distress   pt alert.

## 2023-09-04 ENCOUNTER — Other Ambulatory Visit: Payer: Self-pay | Admitting: Physician Assistant

## 2023-09-04 ENCOUNTER — Ambulatory Visit
Admission: RE | Admit: 2023-09-04 | Discharge: 2023-09-04 | Disposition: A | Discharge status: Home / Self Care | Source: Ambulatory Visit | Attending: Physician Assistant | Admitting: Physician Assistant

## 2023-09-04 DIAGNOSIS — C73 Malignant neoplasm of thyroid gland: Secondary | ICD-10-CM | POA: Insufficient documentation

## 2023-09-04 MED ORDER — IOHEXOL 300 MG/ML  SOLN
75.0000 mL | Freq: Once | INTRAMUSCULAR | Status: AC | PRN
  Administered 2023-09-04: 75 mL via INTRAVENOUS
